# Patient Record
Sex: Female | Born: 1960 | Race: Black or African American | Hispanic: No | Smoking: Never smoker
Health system: Southern US, Community
[De-identification: ages and names within clinical notes are randomized; demographics above are authoritative.]

## PROBLEM LIST (undated history)

## (undated) DIAGNOSIS — F32A Depression, unspecified: Secondary | ICD-10-CM

## (undated) DIAGNOSIS — I10 Essential (primary) hypertension: Secondary | ICD-10-CM

## (undated) DIAGNOSIS — B20 Human immunodeficiency virus [HIV] disease: Secondary | ICD-10-CM

## (undated) DIAGNOSIS — N959 Unspecified menopausal and perimenopausal disorder: Secondary | ICD-10-CM

## (undated) DIAGNOSIS — Z21 Asymptomatic human immunodeficiency virus [HIV] infection status: Secondary | ICD-10-CM

## (undated) DIAGNOSIS — F419 Anxiety disorder, unspecified: Secondary | ICD-10-CM

## (undated) HISTORY — DX: Anxiety disorder, unspecified: F41.9

## (undated) HISTORY — DX: Essential (primary) hypertension: I10

## (undated) HISTORY — DX: Asymptomatic human immunodeficiency virus (hiv) infection status: Z21

## (undated) HISTORY — DX: Human immunodeficiency virus (HIV) disease: B20

## (undated) HISTORY — DX: Depression, unspecified: F32.A

## (undated) HISTORY — DX: Unspecified menopausal and perimenopausal disorder: N95.9

## (undated) HISTORY — PX: NO PAST SURGERIES: SHX2092

---

## 2003-02-12 ENCOUNTER — Encounter (INDEPENDENT_AMBULATORY_CARE_PROVIDER_SITE_OTHER): Payer: Self-pay | Admitting: *Deleted

## 2003-02-12 LAB — CONVERTED CEMR LAB: CD4 Count: 90 microliters

## 2004-02-16 ENCOUNTER — Ambulatory Visit (HOSPITAL_COMMUNITY): Admission: RE | Admit: 2004-02-16 | Discharge: 2004-02-16 | Payer: Self-pay | Admitting: Infectious Diseases

## 2004-02-16 ENCOUNTER — Encounter: Admission: RE | Admit: 2004-02-16 | Discharge: 2004-02-16 | Payer: Self-pay | Admitting: Infectious Diseases

## 2004-03-01 ENCOUNTER — Encounter: Admission: RE | Admit: 2004-03-01 | Discharge: 2004-03-01 | Payer: Self-pay | Admitting: Infectious Diseases

## 2004-03-01 ENCOUNTER — Encounter (INDEPENDENT_AMBULATORY_CARE_PROVIDER_SITE_OTHER): Payer: Self-pay | Admitting: Infectious Diseases

## 2004-03-01 ENCOUNTER — Ambulatory Visit (HOSPITAL_COMMUNITY): Admission: RE | Admit: 2004-03-01 | Discharge: 2004-03-01 | Payer: Self-pay | Admitting: Infectious Diseases

## 2004-04-04 ENCOUNTER — Encounter: Admission: RE | Admit: 2004-04-04 | Discharge: 2004-04-04 | Payer: Self-pay | Admitting: Infectious Diseases

## 2004-08-18 ENCOUNTER — Ambulatory Visit (HOSPITAL_COMMUNITY): Admission: RE | Admit: 2004-08-18 | Discharge: 2004-08-18 | Payer: Self-pay | Admitting: Infectious Diseases

## 2004-08-18 ENCOUNTER — Ambulatory Visit: Payer: Self-pay | Admitting: Infectious Diseases

## 2004-09-12 ENCOUNTER — Ambulatory Visit: Payer: Self-pay | Admitting: Infectious Diseases

## 2004-12-19 ENCOUNTER — Encounter: Admission: RE | Admit: 2004-12-19 | Discharge: 2004-12-19 | Payer: Self-pay | Admitting: Obstetrics & Gynecology

## 2005-01-09 ENCOUNTER — Ambulatory Visit: Payer: Self-pay | Admitting: Infectious Diseases

## 2005-01-09 ENCOUNTER — Ambulatory Visit (HOSPITAL_COMMUNITY): Admission: RE | Admit: 2005-01-09 | Discharge: 2005-01-09 | Payer: Self-pay | Admitting: Infectious Diseases

## 2005-01-30 ENCOUNTER — Ambulatory Visit: Payer: Self-pay | Admitting: Infectious Diseases

## 2005-05-11 ENCOUNTER — Ambulatory Visit (HOSPITAL_COMMUNITY): Admission: RE | Admit: 2005-05-11 | Discharge: 2005-05-11 | Payer: Self-pay | Admitting: Infectious Diseases

## 2005-05-11 ENCOUNTER — Ambulatory Visit: Payer: Self-pay | Admitting: Infectious Diseases

## 2005-06-08 ENCOUNTER — Ambulatory Visit: Payer: Self-pay | Admitting: Infectious Diseases

## 2005-06-16 ENCOUNTER — Ambulatory Visit: Payer: Self-pay | Admitting: Infectious Diseases

## 2005-10-09 ENCOUNTER — Ambulatory Visit (HOSPITAL_COMMUNITY): Admission: RE | Admit: 2005-10-09 | Discharge: 2005-10-09 | Payer: Self-pay | Admitting: Infectious Diseases

## 2005-10-09 ENCOUNTER — Ambulatory Visit: Payer: Self-pay | Admitting: Infectious Diseases

## 2005-10-23 ENCOUNTER — Ambulatory Visit: Payer: Self-pay | Admitting: Infectious Diseases

## 2005-12-15 ENCOUNTER — Encounter: Payer: Self-pay | Admitting: Family Medicine

## 2005-12-15 ENCOUNTER — Ambulatory Visit: Payer: Self-pay | Admitting: Family Medicine

## 2005-12-25 ENCOUNTER — Encounter: Admission: RE | Admit: 2005-12-25 | Discharge: 2005-12-25 | Payer: Self-pay | Admitting: Internal Medicine

## 2006-01-08 ENCOUNTER — Ambulatory Visit: Payer: Self-pay | Admitting: Infectious Diseases

## 2006-01-22 ENCOUNTER — Ambulatory Visit: Payer: Self-pay | Admitting: Infectious Diseases

## 2006-04-16 ENCOUNTER — Ambulatory Visit: Payer: Self-pay | Admitting: Infectious Diseases

## 2006-04-16 ENCOUNTER — Encounter: Admission: RE | Admit: 2006-04-16 | Discharge: 2006-04-16 | Payer: Self-pay | Admitting: Infectious Diseases

## 2006-04-16 ENCOUNTER — Encounter (INDEPENDENT_AMBULATORY_CARE_PROVIDER_SITE_OTHER): Payer: Self-pay | Admitting: *Deleted

## 2006-04-16 LAB — CONVERTED CEMR LAB
CD4 Count: 1010 microliters
HIV 1 RNA Quant: 49 copies/mL

## 2006-04-30 ENCOUNTER — Ambulatory Visit: Payer: Self-pay | Admitting: Infectious Diseases

## 2006-07-12 ENCOUNTER — Encounter (INDEPENDENT_AMBULATORY_CARE_PROVIDER_SITE_OTHER): Payer: Self-pay | Admitting: *Deleted

## 2006-07-12 ENCOUNTER — Ambulatory Visit: Payer: Self-pay | Admitting: Infectious Diseases

## 2006-07-12 ENCOUNTER — Encounter: Admission: RE | Admit: 2006-07-12 | Discharge: 2006-07-12 | Payer: Self-pay | Admitting: Infectious Diseases

## 2006-07-12 LAB — CONVERTED CEMR LAB
CD4 Count: 970 microliters
HIV 1 RNA Quant: 135 copies/mL

## 2006-07-30 ENCOUNTER — Ambulatory Visit: Payer: Self-pay | Admitting: Infectious Diseases

## 2006-08-06 ENCOUNTER — Emergency Department (HOSPITAL_COMMUNITY): Admission: EM | Admit: 2006-08-06 | Discharge: 2006-08-06 | Payer: Self-pay | Admitting: Emergency Medicine

## 2006-10-29 ENCOUNTER — Encounter: Admission: RE | Admit: 2006-10-29 | Discharge: 2006-10-29 | Payer: Self-pay | Admitting: Infectious Diseases

## 2006-10-29 ENCOUNTER — Encounter (INDEPENDENT_AMBULATORY_CARE_PROVIDER_SITE_OTHER): Payer: Self-pay | Admitting: *Deleted

## 2006-10-29 ENCOUNTER — Ambulatory Visit: Payer: Self-pay | Admitting: Infectious Diseases

## 2006-11-21 ENCOUNTER — Ambulatory Visit: Payer: Self-pay | Admitting: Infectious Diseases

## 2006-11-21 ENCOUNTER — Encounter: Payer: Self-pay | Admitting: Internal Medicine

## 2006-11-22 ENCOUNTER — Encounter (INDEPENDENT_AMBULATORY_CARE_PROVIDER_SITE_OTHER): Payer: Self-pay | Admitting: Infectious Diseases

## 2006-12-14 ENCOUNTER — Encounter: Payer: Self-pay | Admitting: Family Medicine

## 2006-12-14 ENCOUNTER — Ambulatory Visit: Payer: Self-pay | Admitting: Obstetrics & Gynecology

## 2006-12-14 ENCOUNTER — Encounter (INDEPENDENT_AMBULATORY_CARE_PROVIDER_SITE_OTHER): Payer: Self-pay | Admitting: *Deleted

## 2006-12-14 ENCOUNTER — Encounter: Payer: Self-pay | Admitting: Internal Medicine

## 2006-12-14 LAB — CONVERTED CEMR LAB: Pap Smear: NORMAL

## 2006-12-26 ENCOUNTER — Encounter: Admission: RE | Admit: 2006-12-26 | Discharge: 2006-12-26 | Payer: Self-pay | Admitting: Internal Medicine

## 2007-02-04 ENCOUNTER — Encounter (INDEPENDENT_AMBULATORY_CARE_PROVIDER_SITE_OTHER): Payer: Self-pay | Admitting: *Deleted

## 2007-02-04 LAB — CONVERTED CEMR LAB

## 2007-02-17 ENCOUNTER — Encounter (INDEPENDENT_AMBULATORY_CARE_PROVIDER_SITE_OTHER): Payer: Self-pay | Admitting: *Deleted

## 2007-04-29 ENCOUNTER — Encounter: Admission: RE | Admit: 2007-04-29 | Discharge: 2007-04-29 | Payer: Self-pay | Admitting: Infectious Diseases

## 2007-04-29 ENCOUNTER — Ambulatory Visit: Payer: Self-pay | Admitting: Infectious Diseases

## 2007-04-29 ENCOUNTER — Encounter: Payer: Self-pay | Admitting: Internal Medicine

## 2007-05-09 ENCOUNTER — Ambulatory Visit: Payer: Self-pay | Admitting: Obstetrics & Gynecology

## 2007-05-20 ENCOUNTER — Ambulatory Visit: Payer: Self-pay | Admitting: Infectious Diseases

## 2007-05-20 ENCOUNTER — Encounter (INDEPENDENT_AMBULATORY_CARE_PROVIDER_SITE_OTHER): Payer: Self-pay | Admitting: *Deleted

## 2007-05-20 DIAGNOSIS — F329 Major depressive disorder, single episode, unspecified: Secondary | ICD-10-CM | POA: Insufficient documentation

## 2007-05-20 DIAGNOSIS — F32A Depression, unspecified: Secondary | ICD-10-CM | POA: Insufficient documentation

## 2007-05-20 DIAGNOSIS — B20 Human immunodeficiency virus [HIV] disease: Secondary | ICD-10-CM

## 2007-08-16 ENCOUNTER — Encounter: Admission: RE | Admit: 2007-08-16 | Discharge: 2007-08-16 | Payer: Self-pay | Admitting: Internal Medicine

## 2007-08-16 ENCOUNTER — Ambulatory Visit: Payer: Self-pay | Admitting: Internal Medicine

## 2007-08-16 LAB — CONVERTED CEMR LAB
AST: 21 units/L (ref 0–37)
BUN: 8 mg/dL (ref 6–23)
Basophils Relative: 1 % (ref 0–1)
Calcium: 9 mg/dL (ref 8.4–10.5)
Chloride: 102 meq/L (ref 96–112)
Creatinine, Ser: 0.82 mg/dL (ref 0.40–1.20)
Eosinophils Absolute: 0.1 10*3/uL (ref 0.0–0.7)
HIV-1 RNA Quant, Log: 2.52 — ABNORMAL HIGH (ref <1.70)
Hemoglobin: 14.3 g/dL (ref 12.0–15.0)
MCHC: 34.4 g/dL (ref 30.0–36.0)
MCV: 84 fL (ref 78.0–100.0)
Monocytes Absolute: 0.3 10*3/uL (ref 0.2–0.7)
Monocytes Relative: 5 % (ref 3–11)
Neutro Abs: 2.7 10*3/uL (ref 1.7–7.7)
RBC: 4.95 M/uL (ref 3.87–5.11)

## 2007-09-20 ENCOUNTER — Encounter (INDEPENDENT_AMBULATORY_CARE_PROVIDER_SITE_OTHER): Payer: Self-pay | Admitting: *Deleted

## 2007-09-20 ENCOUNTER — Ambulatory Visit: Payer: Self-pay | Admitting: Internal Medicine

## 2007-10-28 ENCOUNTER — Telehealth: Payer: Self-pay | Admitting: Internal Medicine

## 2007-11-28 ENCOUNTER — Telehealth: Payer: Self-pay | Admitting: Internal Medicine

## 2007-12-09 ENCOUNTER — Encounter (INDEPENDENT_AMBULATORY_CARE_PROVIDER_SITE_OTHER): Payer: Self-pay | Admitting: *Deleted

## 2007-12-10 ENCOUNTER — Encounter (INDEPENDENT_AMBULATORY_CARE_PROVIDER_SITE_OTHER): Payer: Self-pay | Admitting: *Deleted

## 2007-12-24 ENCOUNTER — Encounter: Admission: RE | Admit: 2007-12-24 | Discharge: 2007-12-24 | Payer: Self-pay | Admitting: Internal Medicine

## 2007-12-24 ENCOUNTER — Ambulatory Visit: Payer: Self-pay | Admitting: Internal Medicine

## 2007-12-24 LAB — CONVERTED CEMR LAB
ALT: 16 units/L (ref 0–35)
AST: 22 units/L (ref 0–37)
Alkaline Phosphatase: 59 units/L (ref 39–117)
BUN: 8 mg/dL (ref 6–23)
Creatinine, Ser: 0.77 mg/dL (ref 0.40–1.20)
Eosinophils Relative: 0 % (ref 0–5)
HCT: 41.1 % (ref 36.0–46.0)
Lymphocytes Relative: 39 % (ref 12–46)
Lymphs Abs: 2.6 10*3/uL (ref 0.7–4.0)
Neutro Abs: 3.8 10*3/uL (ref 1.7–7.7)
Neutrophils Relative %: 56 % (ref 43–77)
Platelets: 284 10*3/uL (ref 150–400)
Potassium: 4.3 meq/L (ref 3.5–5.3)
Sodium: 138 meq/L (ref 135–145)
Total Bilirubin: 1 mg/dL (ref 0.3–1.2)
WBC: 6.7 10*3/uL (ref 4.0–10.5)

## 2007-12-30 ENCOUNTER — Telehealth: Payer: Self-pay | Admitting: Internal Medicine

## 2008-01-06 ENCOUNTER — Encounter (INDEPENDENT_AMBULATORY_CARE_PROVIDER_SITE_OTHER): Payer: Self-pay | Admitting: *Deleted

## 2008-01-07 ENCOUNTER — Ambulatory Visit: Payer: Self-pay | Admitting: Internal Medicine

## 2008-01-10 ENCOUNTER — Ambulatory Visit (HOSPITAL_COMMUNITY): Admission: RE | Admit: 2008-01-10 | Discharge: 2008-01-10 | Payer: Self-pay | Admitting: Internal Medicine

## 2008-01-17 ENCOUNTER — Encounter (INDEPENDENT_AMBULATORY_CARE_PROVIDER_SITE_OTHER): Payer: Self-pay | Admitting: *Deleted

## 2008-01-17 ENCOUNTER — Encounter: Payer: Self-pay | Admitting: Internal Medicine

## 2008-01-17 ENCOUNTER — Ambulatory Visit: Payer: Self-pay | Admitting: Internal Medicine

## 2008-01-22 ENCOUNTER — Encounter: Payer: Self-pay | Admitting: Internal Medicine

## 2008-01-29 ENCOUNTER — Telehealth: Payer: Self-pay | Admitting: Internal Medicine

## 2008-02-17 ENCOUNTER — Encounter (INDEPENDENT_AMBULATORY_CARE_PROVIDER_SITE_OTHER): Payer: Self-pay | Admitting: *Deleted

## 2008-02-25 ENCOUNTER — Telehealth: Payer: Self-pay | Admitting: Internal Medicine

## 2008-03-17 ENCOUNTER — Encounter: Payer: Self-pay | Admitting: Internal Medicine

## 2008-03-26 ENCOUNTER — Telehealth (INDEPENDENT_AMBULATORY_CARE_PROVIDER_SITE_OTHER): Payer: Self-pay | Admitting: *Deleted

## 2008-03-30 ENCOUNTER — Encounter: Payer: Self-pay | Admitting: Internal Medicine

## 2008-04-06 ENCOUNTER — Encounter: Admission: RE | Admit: 2008-04-06 | Discharge: 2008-04-06 | Payer: Self-pay | Admitting: Internal Medicine

## 2008-04-06 ENCOUNTER — Ambulatory Visit: Payer: Self-pay | Admitting: Internal Medicine

## 2008-04-06 LAB — CONVERTED CEMR LAB
Albumin: 4.2 g/dL (ref 3.5–5.2)
BUN: 11 mg/dL (ref 6–23)
CO2: 22 meq/L (ref 19–32)
Eosinophils Relative: 1 % (ref 0–5)
Glucose, Bld: 86 mg/dL (ref 70–99)
HCT: 39.3 % (ref 36.0–46.0)
HIV 1 RNA Quant: 99 copies/mL — ABNORMAL HIGH (ref <50)
HIV-1 RNA Quant, Log: 2 — ABNORMAL HIGH (ref <1.70)
Hemoglobin: 13.7 g/dL (ref 12.0–15.0)
Lymphocytes Relative: 44 % (ref 12–46)
Lymphs Abs: 3.5 10*3/uL (ref 0.7–4.0)
Monocytes Absolute: 0.4 10*3/uL (ref 0.1–1.0)
Monocytes Relative: 5 % (ref 3–12)
Platelets: 262 10*3/uL (ref 150–400)
RBC: 4.7 M/uL (ref 3.87–5.11)
Sodium: 138 meq/L (ref 135–145)
Total Bilirubin: 0.5 mg/dL (ref 0.3–1.2)
Total Protein: 7.8 g/dL (ref 6.0–8.3)
WBC: 8.1 10*3/uL (ref 4.0–10.5)

## 2008-04-22 ENCOUNTER — Ambulatory Visit: Payer: Self-pay | Admitting: Internal Medicine

## 2008-04-22 DIAGNOSIS — R05 Cough: Secondary | ICD-10-CM

## 2008-04-27 ENCOUNTER — Telehealth (INDEPENDENT_AMBULATORY_CARE_PROVIDER_SITE_OTHER): Payer: Self-pay | Admitting: *Deleted

## 2008-05-19 ENCOUNTER — Encounter: Payer: Self-pay | Admitting: Internal Medicine

## 2008-05-25 ENCOUNTER — Telehealth (INDEPENDENT_AMBULATORY_CARE_PROVIDER_SITE_OTHER): Payer: Self-pay | Admitting: *Deleted

## 2008-05-29 ENCOUNTER — Emergency Department (HOSPITAL_COMMUNITY): Admission: EM | Admit: 2008-05-29 | Discharge: 2008-05-29 | Payer: Self-pay | Admitting: Family Medicine

## 2008-06-01 ENCOUNTER — Telehealth: Payer: Self-pay | Admitting: Internal Medicine

## 2008-06-24 ENCOUNTER — Telehealth (INDEPENDENT_AMBULATORY_CARE_PROVIDER_SITE_OTHER): Payer: Self-pay | Admitting: *Deleted

## 2008-07-22 ENCOUNTER — Telehealth (INDEPENDENT_AMBULATORY_CARE_PROVIDER_SITE_OTHER): Payer: Self-pay | Admitting: *Deleted

## 2008-08-24 ENCOUNTER — Telehealth (INDEPENDENT_AMBULATORY_CARE_PROVIDER_SITE_OTHER): Payer: Self-pay | Admitting: *Deleted

## 2008-09-18 ENCOUNTER — Telehealth (INDEPENDENT_AMBULATORY_CARE_PROVIDER_SITE_OTHER): Payer: Self-pay | Admitting: *Deleted

## 2008-09-21 ENCOUNTER — Encounter (INDEPENDENT_AMBULATORY_CARE_PROVIDER_SITE_OTHER): Payer: Self-pay | Admitting: *Deleted

## 2008-09-21 ENCOUNTER — Ambulatory Visit: Payer: Self-pay | Admitting: Internal Medicine

## 2008-09-21 LAB — CONVERTED CEMR LAB
ALT: 12 units/L (ref 0–35)
Basophils Absolute: 0 10*3/uL (ref 0.0–0.1)
CO2: 19 meq/L (ref 19–32)
Calcium: 9.3 mg/dL (ref 8.4–10.5)
Chloride: 105 meq/L (ref 96–112)
HIV 1 RNA Quant: 518 copies/mL — ABNORMAL HIGH (ref <50)
Hemoglobin: 13.7 g/dL (ref 12.0–15.0)
Lymphocytes Relative: 46 % (ref 12–46)
Neutro Abs: 2.9 10*3/uL (ref 1.7–7.7)
Platelets: 289 10*3/uL (ref 150–400)
RDW: 14.3 % (ref 11.5–15.5)
Sodium: 138 meq/L (ref 135–145)
Total Protein: 7.1 g/dL (ref 6.0–8.3)

## 2008-10-09 ENCOUNTER — Ambulatory Visit: Payer: Self-pay | Admitting: Internal Medicine

## 2008-10-09 DIAGNOSIS — K047 Periapical abscess without sinus: Secondary | ICD-10-CM

## 2008-10-21 ENCOUNTER — Telehealth (INDEPENDENT_AMBULATORY_CARE_PROVIDER_SITE_OTHER): Payer: Self-pay | Admitting: *Deleted

## 2008-11-18 ENCOUNTER — Telehealth (INDEPENDENT_AMBULATORY_CARE_PROVIDER_SITE_OTHER): Payer: Self-pay | Admitting: *Deleted

## 2008-12-14 ENCOUNTER — Telehealth (INDEPENDENT_AMBULATORY_CARE_PROVIDER_SITE_OTHER): Payer: Self-pay | Admitting: *Deleted

## 2009-01-11 ENCOUNTER — Ambulatory Visit: Payer: Self-pay | Admitting: Internal Medicine

## 2009-01-11 LAB — CONVERTED CEMR LAB
ALT: 13 units/L (ref 0–35)
AST: 18 units/L (ref 0–37)
Alkaline Phosphatase: 61 units/L (ref 39–117)
Basophils Absolute: 0 10*3/uL (ref 0.0–0.1)
Basophils Relative: 0 % (ref 0–1)
Calcium: 9 mg/dL (ref 8.4–10.5)
Chloride: 107 meq/L (ref 96–112)
Creatinine, Ser: 0.81 mg/dL (ref 0.40–1.20)
MCHC: 33.3 g/dL (ref 30.0–36.0)
Monocytes Absolute: 0.3 10*3/uL (ref 0.1–1.0)
Neutro Abs: 2.3 10*3/uL (ref 1.7–7.7)
Neutrophils Relative %: 37 % — ABNORMAL LOW (ref 43–77)
Potassium: 4.5 meq/L (ref 3.5–5.3)
RDW: 14.2 % (ref 11.5–15.5)

## 2009-01-15 ENCOUNTER — Telehealth (INDEPENDENT_AMBULATORY_CARE_PROVIDER_SITE_OTHER): Payer: Self-pay | Admitting: *Deleted

## 2009-01-27 ENCOUNTER — Ambulatory Visit: Payer: Self-pay | Admitting: Internal Medicine

## 2009-02-02 ENCOUNTER — Encounter (INDEPENDENT_AMBULATORY_CARE_PROVIDER_SITE_OTHER): Payer: Self-pay | Admitting: *Deleted

## 2009-02-15 ENCOUNTER — Telehealth (INDEPENDENT_AMBULATORY_CARE_PROVIDER_SITE_OTHER): Payer: Self-pay | Admitting: *Deleted

## 2009-02-16 ENCOUNTER — Ambulatory Visit (HOSPITAL_COMMUNITY): Admission: RE | Admit: 2009-02-16 | Discharge: 2009-02-16 | Payer: Self-pay | Admitting: Internal Medicine

## 2009-02-24 ENCOUNTER — Encounter (INDEPENDENT_AMBULATORY_CARE_PROVIDER_SITE_OTHER): Payer: Self-pay | Admitting: *Deleted

## 2009-02-26 ENCOUNTER — Ambulatory Visit: Payer: Self-pay | Admitting: Internal Medicine

## 2009-02-26 ENCOUNTER — Encounter: Payer: Self-pay | Admitting: Internal Medicine

## 2009-03-05 ENCOUNTER — Encounter: Payer: Self-pay | Admitting: Internal Medicine

## 2009-03-15 ENCOUNTER — Telehealth (INDEPENDENT_AMBULATORY_CARE_PROVIDER_SITE_OTHER): Payer: Self-pay | Admitting: *Deleted

## 2009-04-07 ENCOUNTER — Telehealth (INDEPENDENT_AMBULATORY_CARE_PROVIDER_SITE_OTHER): Payer: Self-pay | Admitting: *Deleted

## 2009-04-27 ENCOUNTER — Ambulatory Visit: Payer: Self-pay | Admitting: Internal Medicine

## 2009-04-27 LAB — CONVERTED CEMR LAB
Albumin: 3.8 g/dL (ref 3.5–5.2)
Basophils Absolute: 0 10*3/uL (ref 0.0–0.1)
Basophils Relative: 1 % (ref 0–1)
CO2: 24 meq/L (ref 19–32)
Calcium: 9.4 mg/dL (ref 8.4–10.5)
Chloride: 104 meq/L (ref 96–112)
Cholesterol: 177 mg/dL (ref 0–200)
GFR calc Af Amer: >60 mL/min (ref >60)
GFR calc non Af Amer: >60 mL/min (ref >60)
Glucose, Bld: 96 mg/dL (ref 70–99)
HIV-1 RNA Quant, Log: <1.68 (ref <1.68)
Lymphocytes Relative: 41 % (ref 12–46)
MCHC: 33.4 g/dL (ref 30.0–36.0)
Neutro Abs: 2.5 10*3/uL (ref 1.7–7.7)
Neutrophils Relative %: 48 % (ref 43–77)
Platelets: 236 10*3/uL (ref 150–400)
RDW: 14.2 % (ref 11.5–15.5)
Sodium: 140 meq/L (ref 135–145)
Total Bilirubin: 0.5 mg/dL (ref 0.3–1.2)
Total Protein: 7.3 g/dL (ref 6.0–8.3)
Triglycerides: 159 mg/dL — ABNORMAL HIGH (ref <150)

## 2009-05-05 ENCOUNTER — Telehealth (INDEPENDENT_AMBULATORY_CARE_PROVIDER_SITE_OTHER): Payer: Self-pay | Admitting: *Deleted

## 2009-05-12 ENCOUNTER — Ambulatory Visit: Payer: Self-pay | Admitting: Internal Medicine

## 2009-06-08 ENCOUNTER — Telehealth (INDEPENDENT_AMBULATORY_CARE_PROVIDER_SITE_OTHER): Payer: Self-pay | Admitting: *Deleted

## 2009-07-05 ENCOUNTER — Telehealth (INDEPENDENT_AMBULATORY_CARE_PROVIDER_SITE_OTHER): Payer: Self-pay | Admitting: *Deleted

## 2009-07-05 ENCOUNTER — Ambulatory Visit: Payer: Self-pay | Admitting: Internal Medicine

## 2009-07-16 ENCOUNTER — Encounter: Admission: RE | Admit: 2009-07-16 | Discharge: 2009-07-16 | Payer: Self-pay | Admitting: Specialist

## 2009-07-30 ENCOUNTER — Telehealth (INDEPENDENT_AMBULATORY_CARE_PROVIDER_SITE_OTHER): Payer: Self-pay | Admitting: *Deleted

## 2009-08-23 ENCOUNTER — Ambulatory Visit: Payer: Self-pay | Admitting: Internal Medicine

## 2009-08-23 LAB — CONVERTED CEMR LAB
ALT: 12 units/L (ref 0–35)
Albumin: 4.1 g/dL (ref 3.5–5.2)
Alkaline Phosphatase: 71 units/L (ref 39–117)
Basophils Relative: 0 % (ref 0–1)
CO2: 22 meq/L (ref 19–32)
Eosinophils Absolute: 0.1 10*3/uL (ref 0.0–0.7)
Glucose, Bld: 87 mg/dL (ref 70–99)
HIV 1 RNA Quant: 224 copies/mL — ABNORMAL HIGH (ref <48)
MCHC: 32.4 g/dL (ref 30.0–36.0)
MCV: 87.5 fL (ref >=78.0)
Monocytes Relative: 4 % (ref 3–12)
Neutro Abs: 2.9 10*3/uL (ref 1.7–7.7)
Neutrophils Relative %: 50 % (ref 43–77)
Platelets: 241 10*3/uL (ref 150–400)
Potassium: 4.7 meq/L (ref 3.5–5.3)
RBC: 4.73 M/uL (ref 3.87–5.11)
Sodium: 141 meq/L (ref 135–145)
Total Protein: 7.5 g/dL (ref 6.0–8.3)
WBC: 5.8 10*3/uL (ref 4.0–10.5)

## 2009-09-01 ENCOUNTER — Telehealth (INDEPENDENT_AMBULATORY_CARE_PROVIDER_SITE_OTHER): Payer: Self-pay | Admitting: *Deleted

## 2009-09-07 ENCOUNTER — Ambulatory Visit: Payer: Self-pay | Admitting: Internal Medicine

## 2009-09-29 ENCOUNTER — Telehealth (INDEPENDENT_AMBULATORY_CARE_PROVIDER_SITE_OTHER): Payer: Self-pay | Admitting: *Deleted

## 2009-10-28 ENCOUNTER — Telehealth (INDEPENDENT_AMBULATORY_CARE_PROVIDER_SITE_OTHER): Payer: Self-pay | Admitting: *Deleted

## 2009-12-09 ENCOUNTER — Telehealth (INDEPENDENT_AMBULATORY_CARE_PROVIDER_SITE_OTHER): Payer: Self-pay | Admitting: *Deleted

## 2009-12-27 ENCOUNTER — Ambulatory Visit: Payer: Self-pay | Admitting: Internal Medicine

## 2009-12-27 ENCOUNTER — Encounter (INDEPENDENT_AMBULATORY_CARE_PROVIDER_SITE_OTHER): Payer: Self-pay | Admitting: *Deleted

## 2009-12-27 LAB — CONVERTED CEMR LAB: HIV 1 RNA Quant: 402 copies/mL — ABNORMAL HIGH (ref <48)

## 2009-12-28 ENCOUNTER — Telehealth (INDEPENDENT_AMBULATORY_CARE_PROVIDER_SITE_OTHER): Payer: Self-pay | Admitting: *Deleted

## 2009-12-28 ENCOUNTER — Encounter: Payer: Self-pay | Admitting: Internal Medicine

## 2009-12-28 LAB — CONVERTED CEMR LAB
ALT: 13 units/L (ref 0–35)
Alkaline Phosphatase: 74 units/L (ref 39–117)
Basophils Absolute: 0 10*3/uL (ref 0.0–0.1)
Basophils Relative: 0 % (ref 0–1)
CO2: 24 meq/L (ref 19–32)
Creatinine, Ser: 0.74 mg/dL (ref 0.40–1.20)
Eosinophils Absolute: 0.1 10*3/uL (ref 0.0–0.7)
MCHC: 31.9 g/dL (ref 30.0–36.0)
MCV: 86.1 fL (ref >=78.0)
Monocytes Relative: 4 % (ref 3–12)
Neutro Abs: 2.8 10*3/uL (ref 1.7–7.7)
Neutrophils Relative %: 42 % — ABNORMAL LOW (ref 43–77)
Platelets: 291 10*3/uL (ref 150–400)
RBC: 4.96 M/uL (ref 3.87–5.11)
RDW: 14.7 % (ref 11.5–15.5)
Sodium: 139 meq/L (ref 135–145)
Total Bilirubin: 0.5 mg/dL (ref 0.3–1.2)
Total Protein: 7.9 g/dL (ref 6.0–8.3)

## 2010-01-12 ENCOUNTER — Ambulatory Visit: Payer: Self-pay | Admitting: Internal Medicine

## 2010-01-20 ENCOUNTER — Encounter (INDEPENDENT_AMBULATORY_CARE_PROVIDER_SITE_OTHER): Payer: Self-pay | Admitting: *Deleted

## 2010-01-27 ENCOUNTER — Telehealth (INDEPENDENT_AMBULATORY_CARE_PROVIDER_SITE_OTHER): Payer: Self-pay | Admitting: *Deleted

## 2010-02-21 ENCOUNTER — Telehealth (INDEPENDENT_AMBULATORY_CARE_PROVIDER_SITE_OTHER): Payer: Self-pay | Admitting: *Deleted

## 2010-03-24 ENCOUNTER — Telehealth (INDEPENDENT_AMBULATORY_CARE_PROVIDER_SITE_OTHER): Payer: Self-pay | Admitting: *Deleted

## 2010-04-13 ENCOUNTER — Ambulatory Visit: Payer: Self-pay | Admitting: Internal Medicine

## 2010-04-13 LAB — CONVERTED CEMR LAB
Albumin: 4.2 g/dL (ref 3.5–5.2)
BUN: 5 mg/dL — ABNORMAL LOW (ref 6–23)
Basophils Relative: 0 % (ref 0–1)
CO2: 21 meq/L (ref 19–32)
Eosinophils Relative: 3 % (ref 0–5)
Glucose, Bld: 87 mg/dL (ref 70–99)
HCT: 39.5 % (ref 36.0–46.0)
HIV 1 RNA Quant: <48 copies/mL (ref <48)
Hemoglobin: 13.1 g/dL (ref 12.0–15.0)
Lymphocytes Relative: 50 % — ABNORMAL HIGH (ref 12–46)
MCHC: 33.2 g/dL (ref 30.0–36.0)
Monocytes Absolute: 0.3 10*3/uL (ref 0.1–1.0)
Monocytes Relative: 5 % (ref 3–12)
Neutro Abs: 2.5 10*3/uL (ref 1.7–7.7)
Potassium: 4.6 meq/L (ref 3.5–5.3)
RBC: 4.6 M/uL (ref 3.87–5.11)
Sodium: 137 meq/L (ref 135–145)
Total Protein: 7.5 g/dL (ref 6.0–8.3)

## 2010-04-18 ENCOUNTER — Telehealth: Payer: Self-pay | Admitting: Internal Medicine

## 2010-04-27 ENCOUNTER — Ambulatory Visit: Payer: Self-pay | Admitting: Internal Medicine

## 2010-04-27 DIAGNOSIS — I10 Essential (primary) hypertension: Secondary | ICD-10-CM | POA: Insufficient documentation

## 2010-05-05 ENCOUNTER — Ambulatory Visit (HOSPITAL_COMMUNITY): Admission: RE | Admit: 2010-05-05 | Discharge: 2010-05-05 | Payer: Self-pay | Admitting: Internal Medicine

## 2010-05-13 ENCOUNTER — Ambulatory Visit: Payer: Self-pay | Admitting: Internal Medicine

## 2010-05-17 ENCOUNTER — Telehealth: Payer: Self-pay | Admitting: Internal Medicine

## 2010-05-20 ENCOUNTER — Telehealth: Payer: Self-pay

## 2010-05-20 ENCOUNTER — Encounter: Payer: Self-pay | Admitting: Internal Medicine

## 2010-06-20 ENCOUNTER — Telehealth: Payer: Self-pay | Admitting: Internal Medicine

## 2010-07-13 ENCOUNTER — Telehealth: Payer: Self-pay | Admitting: Internal Medicine

## 2010-08-09 ENCOUNTER — Telehealth: Payer: Self-pay | Admitting: Internal Medicine

## 2010-09-14 ENCOUNTER — Telehealth (INDEPENDENT_AMBULATORY_CARE_PROVIDER_SITE_OTHER): Payer: Self-pay | Admitting: *Deleted

## 2010-10-14 ENCOUNTER — Telehealth (INDEPENDENT_AMBULATORY_CARE_PROVIDER_SITE_OTHER): Payer: Self-pay | Admitting: *Deleted

## 2010-11-02 ENCOUNTER — Ambulatory Visit: Payer: Self-pay | Admitting: Internal Medicine

## 2010-11-02 LAB — CONVERTED CEMR LAB
Alkaline Phosphatase: 75 units/L (ref 39–117)
BUN: 11 mg/dL (ref 6–23)
Basophils Relative: 0 % (ref 0–1)
Creatinine, Ser: 0.77 mg/dL (ref 0.40–1.20)
Glucose, Bld: 87 mg/dL (ref 70–99)
HCT: 39.6 % (ref 36.0–46.0)
HDL: 41 mg/dL (ref >39)
HIV 1 RNA Quant: <20 copies/mL (ref <20)
Hemoglobin: 13.2 g/dL (ref 12.0–15.0)
LDL Cholesterol: 163 mg/dL — ABNORMAL HIGH (ref 0–99)
MCHC: 33.3 g/dL (ref 30.0–36.0)
MCV: 84.8 fL (ref 78.0–100.0)
Monocytes Absolute: 0.2 10*3/uL (ref 0.1–1.0)
Monocytes Relative: 4 % (ref 3–12)
Neutro Abs: 2.4 10*3/uL (ref 1.7–7.7)
RBC: 4.67 M/uL (ref 3.87–5.11)
Total Bilirubin: 0.4 mg/dL (ref 0.3–1.2)

## 2010-12-08 ENCOUNTER — Ambulatory Visit
Admission: RE | Admit: 2010-12-08 | Discharge: 2010-12-08 | Payer: Self-pay | Source: Home / Self Care | Attending: Internal Medicine | Admitting: Internal Medicine

## 2010-12-08 ENCOUNTER — Telehealth (INDEPENDENT_AMBULATORY_CARE_PROVIDER_SITE_OTHER): Payer: Self-pay | Admitting: *Deleted

## 2010-12-15 ENCOUNTER — Ambulatory Visit
Admission: RE | Admit: 2010-12-15 | Discharge: 2010-12-15 | Payer: Self-pay | Source: Home / Self Care | Attending: Internal Medicine | Admitting: Internal Medicine

## 2011-01-08 LAB — CONVERTED CEMR LAB
ALT: 15 units/L (ref 0–35)
Albumin: 4.1 g/dL (ref 3.5–5.2)
Alkaline Phosphatase: 60 units/L (ref 39–117)
BUN: 14 mg/dL (ref 6–23)
Basophils Absolute: 0 10*3/uL (ref 0.0–0.1)
Basophils Relative: 1 % (ref 0–1)
Bilirubin Urine: NEGATIVE
CO2: 23 meq/L (ref 19–32)
Calcium: 9.4 mg/dL (ref 8.4–10.5)
Chloride: 102 meq/L (ref 96–112)
Eosinophils Absolute: 0.1 10*3/uL (ref 0.0–0.7)
Eosinophils Relative: 1 % (ref 0–5)
Glucose, Bld: 88 mg/dL (ref 70–99)
Glucose, Bld: 92 mg/dL (ref 70–99)
HCT: 41.2 % (ref 36.0–46.0)
HDL: 49 mg/dL (ref >39)
HIV 1 RNA Quant: 249 copies/mL — ABNORMAL HIGH (ref <50)
HIV-1 RNA Quant, Log: 2.4 — ABNORMAL HIGH (ref <1.70)
Hemoglobin: 14.3 g/dL (ref 11.7–14.8)
Ketones, ur: NEGATIVE mg/dL
LDL Cholesterol: 116 mg/dL — ABNORMAL HIGH (ref 0–99)
Lymphocytes Relative: 48 % — ABNORMAL HIGH (ref 15–43)
Lymphs Abs: 2.9 10*3/uL (ref 0.7–3.3)
MCHC: 33.3 g/dL (ref 30.0–36.0)
MCHC: 33.4 g/dL (ref 33.1–35.4)
MCV: 87.3 fL (ref 78.0–100.0)
Neutro Abs: 3.1 10*3/uL (ref 1.8–6.8)
Neutrophils Relative %: 46 % — ABNORMAL LOW (ref 47–77)
Platelets: 287 10*3/uL (ref 150–400)
Protein, ur: NEGATIVE mg/dL
RBC: 5.01 M/uL — ABNORMAL HIGH (ref 3.79–4.96)
RDW: 13.9 % (ref 11.5–15.3)
Sodium: 137 meq/L (ref 135–145)
Sodium: 138 meq/L (ref 135–145)
Total Bilirubin: 0.5 mg/dL (ref 0.3–1.2)
Total Protein: 7.6 g/dL (ref 6.0–8.3)
Total Protein: 7.7 g/dL (ref 6.0–8.3)
Triglycerides: 177 mg/dL — ABNORMAL HIGH (ref <150)
Urobilinogen, UA: 0.2 (ref 0.0–1.0)
VLDL: 35 mg/dL (ref 0–40)
WBC: 6.2 10*3/uL (ref 4.0–10.5)

## 2011-01-10 NOTE — Progress Notes (Signed)
Summary: Returning Call   Phone Note Call from Patient   Caller: Patient Summary of Call: Pt stated she has several calls from this office on her phone. She wanted to know why we were calling.  It looks like Annice Pih was trying to reach pt to inform her yeast was present with pap smear results.  Pap smear was normal . Information given.  It looks like Byrd Hesselbach has ordered medications and it was sent to pharmacy. Tomasita Morrow RN  May 20, 2010 2:43 PM

## 2011-01-10 NOTE — Letter (Signed)
Summary: Results Follow-up Letter  [facility]  [address]   [place], [place] [address]   Phone: [phone]  Fax: [phone]          May 20, 2010  [address] [place], [place]  [address]  Dear Ms. Capistran,   The following are the results of your recent test(s):  Test     Result     Pap Smear    Normal__XXX__  Not Normal_____  Comments:  Everything was normal.   I will see you next year for your next PAP smear.  Thank you for coming to the Center for your care.  Sincerely,    Jennet Maduro [facility]

## 2011-01-10 NOTE — Assessment & Plan Note (Signed)
Summary: f/u [mkj]   CC:  follow-up visit, lab results, needs pap, mammo, and and eye exam.  History of Present Illness: Pt here for lab results. She has not missed any doses of her HIV meds. She needs a mammogram scheduled and has a PAP scheduled for this Friday.  Depression History:      The patient is having a depressed mood most of the day but denies diminished interest in her usual daily activities.        The patient denies that she feels like life is not worth living, denies that she wishes that she were dead, and denies that she has thought about ending her life.        Preventive Screening-Counseling & Management  Alcohol-Tobacco     Alcohol drinks/day: 0     Smoking Status: never  Caffeine-Diet-Exercise     Caffeine use/day: coffee everyday     Does Patient Exercise: yes     Type of exercise: active     Exercise (avg: min/session): >60     Times/week: 7  Safety-Violence-Falls     Seat Belt Use: yes   Updated Prior Medication List: TRUVADA 200-300 MG TABS (EMTRICITABINE-TENOFOVIR) once daily KALETRA 200-50 MG TABS (LOPINAVIR-RITONAVIR) Take 2 tablets by mouth twice a day CLARITIN 10 MG  TABS (LORATADINE) Take 1 tablet by mouth once a day CELEXA 20 MG TABS (CITALOPRAM HYDROBROMIDE) Take 1 tablet by mouth once a day ATENOLOL 50 MG TABS (ATENOLOL) Take 1 tablet by mouth once a day  Current Allergies (reviewed today): ! SULFA Review of Systems  The patient denies anorexia, fever, and weight loss.    Vital Signs:  Patient profile:   50 year old female Menstrual status:  regular Height:      70 inches (177.80 cm) Weight:      244.0 pounds (110.91 kg) BMI:     35.14 Temp:     97.7 degrees F (36.50 degrees C) oral Pulse rate:   74 / minute BP sitting:   150 / 96  (left arm)  Vitals Entered By: Wendall Mola CMA Duncan Dull) (Apr 27, 2010 10:38 AM) CC: follow-up visit, lab results, needs pap, mammo, and eye exam Is Patient Diabetic? No Pain Assessment Patient  in pain? no      Nutritional Status BMI of > 30 = obese Nutritional Status Detail appetite "ok"  Does patient need assistance? Functional Status Self care Ambulation Normal Comments no missed doses of meds per patient pt. has stopped Celexa   Physical Exam  General:  alert, well-developed, well-nourished, and well-hydrated.   Head:  normocephalic and atraumatic.   Mouth:  pharynx pink and moist.   Lungs:  normal breath sounds.      Impression & Recommendations:  Problem # 1:  HIV INFECTION (ICD-042) Pt.s most recent CD4ct was 1100 and VL <48 .  Pt instructed to continue the current antiretroviral regimen.  Pt encouraged to take medication regularly and not miss doses.  Pt will f/u in 3 months for repeat blood work and will see me 2 weeks later.  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 Patient Level III ([address]213)Future Orders: T-CD4SP ([facility]) (CD4SP) ... 07/26/2010 T-HIV Viral Load [phone]) ... 07/26/2010 T-Comprehensive Metabolic Panel [phone]) .Marland KitchenMarland Kitchen  07/26/2010 T-CBC w/Diff ([id number]) ... 07/26/2010 T-Lipid Profile [id number]) ... 07/26/2010  Problem # 2:  ESSENTIAL HYPERTENSION (ICD-401.9) will start pt on atenolol and re-check next visit. Her updated medication list for this problem includes:    Atenolol 50 Mg Tabs (Atenolol) .Marland Kitchen... Take 1 tablet by mouth once a day  Problem # 3:  PREVENTIVE HEALTH CARE (ICD-V70.0) PAP scheduled.  will schedule screening mammogram. Orders: Mammogram (Screening) (Mammo)  Medications Added to Medication List This Visit: 1)  Atenolol 50 Mg Tabs (Atenolol) .... Take 1 tablet by mouth once a day  Patient Instructions: 1)  Please schedule a follow-up appointment in 3  months, 2 weeks after labs.  Prescriptions: ATENOLOL 50 MG TABS (ATENOLOL) Take 1 tablet by mouth once a day  #30 x 5   Entered and Authorized by:   Yisroel Ramming MD   Signed by:   Yisroel Ramming MD on 04/27/2010   Method used:   Print then Give to Patient   RxID:   [id number]

## 2011-01-10 NOTE — Progress Notes (Signed)
Summary: NCADAP rxes arrived  Phone Note Outgoing Call   Call placed by: Jennet Maduro RN,  October 14, 2010 2:17 PM Call placed to: Patient Action Taken: Assistance medications ready for pick up Summary of Call: [place] ADAP rxes arrived.  Truvada & Kaletra.  Message left. Jennet Maduro RN  October 14, 2010 2:16 PM      Appended Document: NCADAP rxes arrived Pt. picked up ADAP meds

## 2011-01-10 NOTE — Miscellaneous (Signed)
Summary: New prescription faxed to [facility] NC[facility]AP  Clinical Lists Changes  Medications: Added new medication of FLUCONAZOLE 150 MG TABS (FLUCONAZOLE) Take 1 tablet by mouth once daily for 2 days - Signed Rx of FLUCONAZOLE 150 MG TABS (FLUCONAZOLE) Take 1 tablet by mouth once daily for 2 days;  #2 x 0;  Signed;  Entered by: Paulo Fruit  BS,CPht II,MPH;  Authorized by: Yisroel Ramming MD;  Method used: Electronically to [facility] [phone]*, [address] [place], [place]  [address], Ph: [phone], Fax:    Prescriptions: FLUCONAZOLE 150 MG TABS (FLUCONAZOLE) Take 1 tablet by mouth once daily for 2 days  #2 x 0   Entered by:   Paulo Fruit  BS,CPht II,MPH   Authorized by:   Yisroel Ramming MD   Signed by:   Paulo Fruit  BS,CPht II,MPH on 05/20/2010   Method used:   Electronically to        [facility] [phone]* (retail)       [address]       [place], [place]  [address]       Ph: [phone]       Fax:    RxID:   [id number]  Paulo Fruit  BS,CPht II,MPH  May 20, 2010 12:02 PM

## 2011-01-10 NOTE — Progress Notes (Signed)
Summary: NCADAP/pt assist meds arrived for Mar  Phone Note Refill Request      Prescriptions: KALETRA 200-50 MG TABS (LOPINAVIR-RITONAVIR) Take 2 tablets by mouth twice a day  #120 x 0   Entered by:   Paulo Fruit  BS,CPht II,MPH   Authorized by:   Yisroel Ramming MD   Signed by:   Paulo Fruit  BS,CPht II,MPH on 02/21/2010   Method used:   Samples Given   RxID:   [id number] TRUVADA 200-300 MG TABS (EMTRICITABINE-TENOFOVIR) once daily  #30 x 0   Entered by:   Paulo Fruit  BS,CPht II,MPH   Authorized by:   Yisroel Ramming MD   Signed by:   Paulo Fruit  BS,CPht II,MPH on 02/21/2010   Method used:   Samples Given   RxID:   [id number]   Patient Assist Medication Verification: Medication: Truvada Lot# [id number] Exp Date:04 2014 Tech approval:MLD                Patient Assist Medication Verification: Medication:Kaletra 200/50mg  BMW#[id number] Exp Date:22 Sep 2012 Tech approval:MLD Call placed to patient with message that assistance medications are ready for pick-up. Paulo Fruit  BS,CPht II,MPH  February 21, 2010 3:14 PM

## 2011-01-10 NOTE — Miscellaneous (Signed)
Summary: clinical update/ryan white NcADAP app completed  Clinical Lists Changes  Observations: Added new observation of RWTITLE: D (12/27/2009 13:39) Added new observation of FINASSESSDT: 12/27/2009 (12/27/2009 13:39)

## 2011-01-10 NOTE — Progress Notes (Signed)
Summary: ncadap meds arrived for Aug  Phone Note Refill Request      Prescriptions: KALETRA 200-50 MG TABS (LOPINAVIR-RITONAVIR) Take 2 tablets by mouth twice a day  #120 x 0   Entered by:   Paulo Fruit  BS,CPht II,MPH   Authorized by:   Yisroel Ramming MD   Signed by:   Paulo Fruit  BS,CPht II,MPH on 08/09/2010   Method used:   Samples Given   RxID:   [id number] TRUVADA 200-300 MG TABS (EMTRICITABINE-TENOFOVIR) once daily  #30 x 0   Entered by:   Paulo Fruit  BS,CPht II,MPH   Authorized by:   Yisroel Ramming MD   Signed by:   Paulo Fruit  BS,CPht II,MPH on 08/09/2010   Method used:   Samples Given   RxID:   [id number]  Patient Assist Medication Verification: Medication name: Kaletra 200/50mg  RX # [id number] Tech approval:MLD  Patient Assist Medication Verification: Medication name:Truvada RX # [id number] Tech approval:MLD Call placed to patient with message that assistance medications are ready for pick-up. Paulo Fruit  BS,CPht II,MPH  August 09, 2010 4:37 PM'

## 2011-01-10 NOTE — Progress Notes (Signed)
Summary: NCADAP/pt assist meds arrived for may  Phone Note Refill Request      Prescriptions: KALETRA 200-50 MG TABS (LOPINAVIR-RITONAVIR) Take 2 tablets by mouth twice a day  #120 x 0   Entered by:   Paulo Fruit  BS,CPht II,MPH   Authorized by:   Yisroel Ramming MD   Signed by:   Paulo Fruit  BS,CPht II,MPH on 04/18/2010   Method used:   Samples Given   RxID:   [id number] TRUVADA 200-300 MG TABS (EMTRICITABINE-TENOFOVIR) once daily  #30 x 0   Entered by:   Paulo Fruit  BS,CPht II,MPH   Authorized by:   Yisroel Ramming MD   Signed by:   Paulo Fruit  BS,CPht II,MPH on 04/18/2010   Method used:   Samples Given   RxID:   [id number]   Patient Assist Medication Verification: Medication: Truvada Lot# [id number] Exp Date:10 2014 Tech approval:MLD                Patient Assist Medication Verification: Medication:Kaletra 200/50mg  MWU#[id number] Exp Date:24 Jan 2013 Tech approval:MLD Call placed to patient with message that assistance medications are ready for pick-up. Paulo Fruit  BS,CPht II,MPH  Apr 18, 2010 4:36 PM

## 2011-01-10 NOTE — Assessment & Plan Note (Signed)
Summary: PAP SMEAR VISIT   Vital Signs:  Patient profile:   50 year old female Menstrual status:  regular LMP:     05/04/2010  Vitals Entered By: Jennet Maduro RN (May 13, 2010 10:17 AM) CC: PAP smear visit.  Pt. declined condoms.  Pt. given educational materials re:  HIV and women, BSE, heart disease, diet exercise, nutrition, OIs and self-esteem. Is Patient Diabetic? No Pain Assessment Patient in pain? no       Does patient need assistance? Functional Status Self care Ambulation Normal LMP (date): 05/04/2010 LMP - Character: normal     Menstrual Status regular Enter LMP: 05/04/2010 Last PAP Result NEGATIVE FOR INTRAEPITHELIAL LESIONS OR MALIGNANCY.   Evaluation and Follow-Up  Prevention For Positives: 05/13/2010   Safe sex practices discussed with patient. Condoms offered. Prior Medications: TRUVADA 200-300 MG TABS (EMTRICITABINE-TENOFOVIR) once daily KALETRA 200-50 MG TABS (LOPINAVIR-RITONAVIR) Take 2 tablets by mouth twice a day CLARITIN 10 MG  TABS (LORATADINE) Take 1 tablet by mouth once a day CELEXA 20 MG TABS (CITALOPRAM HYDROBROMIDE) Take 1 tablet by mouth once a day ATENOLOL 50 MG TABS (ATENOLOL) Take 1 tablet by mouth once a day Current Allergies: ! SULFA Orders Added: 1)  Est. Patient Level I [[address]] 2)  T-PAP [facility]) [[address]]             Prevention For Positives: 05/13/2010   Safe sex practices discussed with patient. Condoms offered.

## 2011-01-10 NOTE — Miscellaneous (Signed)
Summary: clinical update/ryan white NCADAP approved til 03/11/11  Clinical Lists Changes  Observations: Added new observation of AIDSDAP: Yes 2011 (01/20/2010 16:22)

## 2011-01-10 NOTE — Progress Notes (Signed)
Summary: [place]AD[facility]/pt assist med arrived for Jun via [facility]  Phone Note Refill Request      Prescriptions: KALETRA 200-50 MG TABS (LOPINAVIR-RITONAVIR) Take 2 tablets by mouth twice a day  #120 x 0   Entered by:   Paulo Fruit  BS,CPht II,MPH   Authorized by:   Yisroel Ramming MD   Signed by:   Paulo Fruit  BS,CPht II,MPH on 05/17/2010   Method used:   Samples Given   RxID:   [id number] TRUVADA 200-300 MG TABS (EMTRICITABINE-TENOFOVIR) once daily  #30 x 0   Entered by:   Paulo Fruit  BS,CPht II,MPH   Authorized by:   Yisroel Ramming MD   Signed by:   Paulo Fruit  BS,CPht II,MPH on 05/17/2010   Method used:   Samples Given   RxID:   [id number]  Patient Assist Medication Verification: Medication name: Kaletra 200/50mg  RX # [id number] Tech approval:MLD  Patient Assist Medication Verification: Medication name:Truvada RX #  [id number] Tech approval:MLD Call placed to patient with message that assistance medications are ready for pick-up. Left message on patient's voicemail Paulo Fruit  BS,CPht II,MPH  May 17, 2010 3:03 PM'

## 2011-01-10 NOTE — Progress Notes (Signed)
Summary: NCADAP/pt assist meds arrived for feb  Phone Note Refill Request      Prescriptions: KALETRA 200-50 MG TABS (LOPINAVIR-RITONAVIR) Take 2 tablets by mouth twice a day  #120 x 0   Entered by:   Paulo Fruit  BS,CPht II,MPH   Authorized by:   Yisroel Ramming MD   Signed by:   Paulo Fruit  BS,CPht II,MPH on 01/27/2010   Method used:   Samples Given   RxID:   [id number] TRUVADA 200-300 MG TABS (EMTRICITABINE-TENOFOVIR) once daily  #30 x 0   Entered by:   Paulo Fruit  BS,CPht II,MPH   Authorized by:   Yisroel Ramming MD   Signed by:   Paulo Fruit  BS,CPht II,MPH on 01/27/2010   Method used:   Samples Given   RxID:   [id number]   Patient Assist Medication Verification: Medication: Kaletra 200/50mg  CZY#[id number] Exp Date:17 Oct 2012 Tech approval:MLD                Patient Assist Medication Verification: Medication: Truvada Lot# [id number] Exp Date:07 2014 Tech approval:MLD Call placed to patient with message that assistance medications are ready for pick-up. Left message Grier Mitts  BS,CPht II,MPH  January 27, 2010 2:14 PM

## 2011-01-10 NOTE — Progress Notes (Signed)
Summary: NCADAP/pt assist med arrived for Jul  Phone Note Refill Request      Prescriptions: FLUCONAZOLE 150 MG TABS (FLUCONAZOLE) Take 1 tablet by mouth once daily for 2 days  #2 x 0   Entered by:   Paulo Fruit  BS,CPht II,MPH   Authorized by:   Yisroel Ramming MD   Signed by:   Paulo Fruit  BS,CPht II,MPH on 06/20/2010   Method used:   Samples Given   RxID:   [id number] KALETRA 200-50 MG TABS (LOPINAVIR-RITONAVIR) Take 2 tablets by mouth twice a day  #120 x 0   Entered by:   Paulo Fruit  BS,CPht II,MPH   Authorized by:   Yisroel Ramming MD   Signed by:   Paulo Fruit  BS,CPht II,MPH on 06/20/2010   Method used:   Samples Given   RxID:   [id number] TRUVADA 200-300 MG TABS (EMTRICITABINE-TENOFOVIR) once daily  #30 x 0   Entered by:   Paulo Fruit  BS,CPht II,MPH   Authorized by:   Yisroel Ramming MD   Signed by:   Paulo Fruit  BS,CPht II,MPH on 06/20/2010   Method used:   Samples Given   RxID:   [id number]  Patient Assist Medication Verification: Medication name: Kaletra 200/50mg  RX # [id number] Tech approval:MLD  Patient Assist Medication Verification: Medication name:Truvada RX #  [id number] Tech approval:MLD  Patient Assist Medication Verification: Medication name:fluconazole 150mg  RX # [id number] Tech approval:MLD Call placed to patient with message that assistance medications are ready for pick-up. Paulo Fruit  BS,CPht II,MPH  June 20, 2010 9:24 AM

## 2011-01-10 NOTE — Letter (Signed)
Summary: Juanell Fairly: Income Verification  Juanell Fairly: Income Verification   Imported By: Florinda Marker 01/05/2010 15:29:43  _____________________________________________________________________  External Attachment:    Type:   Image     Comment:   External Document

## 2011-01-10 NOTE — Progress Notes (Signed)
Summary: NCADAP/pt assist meds arrived for APr  Phone Note Refill Request      Prescriptions: KALETRA 200-50 MG TABS (LOPINAVIR-RITONAVIR) Take 2 tablets by mouth twice a day  #120 x 0   Entered by:   Paulo Fruit  BS,CPht II,MPH   Authorized by:   Yisroel Ramming MD   Signed by:   Paulo Fruit  BS,CPht II,MPH on 03/24/2010   Method used:   Samples Given   RxID:   [id number] TRUVADA 200-300 MG TABS (EMTRICITABINE-TENOFOVIR) once daily  #30 x 0   Entered by:   Paulo Fruit  BS,CPht II,MPH   Authorized by:   Yisroel Ramming MD   Signed by:   Paulo Fruit  BS,CPht II,MPH on 03/24/2010   Method used:   Samples Given   RxID:   [id number]   Patient Assist Medication Verification: Medication: Kaletra 200/50mg  BPZ#[id number] Exp Date:04 Aug 2012 Tech approval:MLD                Patient Assist Medication Verification: Medication:Truvada Lot# [id number] Exp Date:06 2014 Tech approval:MLD Call placed to patient with message that assistance medications are ready for pick-up. Left message on VM. Paulo Fruit  BS,CPht II,MPH  March 24, 2010 4:49 PM

## 2011-01-10 NOTE — Progress Notes (Signed)
Summary: ADAP MEDS  Phone Note Other Incoming   Summary of Call: Truvada and Kaletra arrived from [place].  Called and left message for patient advising her the Medicaitons have arrived and she can come and pick them up. Initial call taken by: Altamease Oiler,  September 14, 2010 9:26 AM

## 2011-01-10 NOTE — Progress Notes (Signed)
Summary: NCADAP meds arrived for Aug-Left message for pt to call  Phone Note Refill Request      Prescriptions: KALETRA 200-50 MG TABS (LOPINAVIR-RITONAVIR) Take 2 tablets by mouth twice a day  #120 x 0   Entered by:   Paulo Fruit  BS,CPht II,MPH   Authorized by:   Yisroel Ramming MD   Signed by:   Paulo Fruit  BS,CPht II,MPH on 07/13/2010   Method used:   Samples Given   RxID:   [id number] TRUVADA 200-300 MG TABS (EMTRICITABINE-TENOFOVIR) once daily  #30 x 0   Entered by:   Paulo Fruit  BS,CPht II,MPH   Authorized by:   Yisroel Ramming MD   Signed by:   Paulo Fruit  BS,CPht II,MPH on 07/13/2010   Method used:   Samples Given   RxID:   [id number]  Patient Assist Medication Verification: Medication name: Truvada RX # [id number] Tech approval:MLD  Patient Assist Medication Verification: Medication name:Kaletra 200/50mg  RX # [id number] Tech approval:MLD Call placed to patient with message that assistance medications are ready for pick-up. Left message on patient's phone to call office.  Paulo Fruit  BS,CPht II,MPH  July 13, 2010 4:00 PM

## 2011-01-10 NOTE — Assessment & Plan Note (Signed)
Summary: 2WK F/U/VS   CC:  f/u labs and Depression.  History of Present Illness: Pt feeling well.  No missed doses of her HIV meds. She is trying to get a job and a green card.  Depression History:      The patient is having a depressed mood most of the day and has a diminished interest in her usual daily activities.        Psychosocial stress factors include major life changes.  The patient denies that she feels like life is not worth living, denies that she wishes that she were dead, and denies that she has thought about ending her life.        Comments:  occassional thoughts of wishing she was dead, but not harming herself. .  Preventive Screening-Counseling & Management  Alcohol-Tobacco     Alcohol drinks/day: 0     Smoking Status: never   Updated Prior Medication List: TRUVADA 200-300 MG TABS (EMTRICITABINE-TENOFOVIR) once daily KALETRA 200-50 MG TABS (LOPINAVIR-RITONAVIR) Take 2 tablets by mouth twice a day CLARITIN 10 MG  TABS (LORATADINE) Take 1 tablet by mouth once a day CELEXA 20 MG TABS (CITALOPRAM HYDROBROMIDE) Take 1 tablet by mouth once a day  Current Allergies (reviewed today): ! SULFA Additional History Menstrual Status:  regular  Review of Systems       The patient complains of weight gain.  The patient denies anorexia, fever, and weight loss.    Vital Signs:  Patient profile:   51 year old female Menstrual status:  regular Height:      70 inches (177.80 cm) Weight:      246 pounds (111.82 kg) BMI:     35.42 Temp:     97.6 degrees F (36.44 degrees C) oral Pulse rate:   86 / minute BP sitting:   150 / 94  (left arm)  Vitals Entered By: Starleen Arms CMA (January 12, 2010 10:32 AM) CC: f/u labs, Depression Is Patient Diabetic? No Pain Assessment Patient in pain? no      Nutritional Status BMI of > 30 = obese  Does patient need assistance? Functional Status Self care Ambulation Normal LMP - Character: normal     Menstrual Status  regular Last PAP Result NEGATIVE FOR INTRAEPITHELIAL LESIONS OR MALIGNANCY.   Physical Exam  General:  alert, well-developed, well-nourished, and well-hydrated.   Head:  normocephalic and atraumatic.   Mouth:  pharynx pink and moist.   Lungs:  normal breath sounds.      Impression & Recommendations:  Problem # 1:  HIV INFECTION (ICD-042) Pt.s most recent CD4ct was 1280 and VL 402 .  Pt instructed to continue the current antiretroviral regimen.  Pt encouraged to take medication regularly and not miss doses.  Pt will f/u in 3 months for repeat blood work and will see me 2 weeks later.  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 Patient Level III (99213)Future Orders: T-CD4SP ([facility]) (CD4SP) ... 04/12/2010 T-HIV Viral Load [phone]) ... 04/12/2010 T-Comprehensive Metabolic Panel [phone]) ... 04/12/2010 T-CBC w/Diff ([id number]) ... 04/12/2010 T-RPR (Syphilis) [phone]) ... 04/12/2010  Patient Instructions: 1)  Please schedule a follow-up appointment in 3 months,2 weeks after labs.  Process Orders Check Orders Results:     [facility]: ABN not required for this insurance Tests Sent for requisitioning (January 12, 2010 10:47 AM):     04/12/2010: [facility] -- T-HIV Viral Load [phone] (signed)     04/12/2010: [facility] -- T-Comprehensive Metabolic Panel [[id number]] (signed)     04/12/2010: [facility] -- T-CBC w/Diff [[id number]] (signed)     04/12/2010: [facility] -- T-RPR (Syphilis) [phone] (signed)       Influenza Immunization History:    Influenza # 1:  Historical (08/11/2009)

## 2011-01-10 NOTE — Progress Notes (Signed)
Summary: [facility]/pt assist meds arrived for Jan  Phone Note Refill Request      Prescriptions: KALETRA 200-50 MG TABS (LOPINAVIR-RITONAVIR) Take 2 tablets by mouth twice a day  #120 x 0   Entered by:   Paulo Fruit  BS,CPht II,MPH   Authorized by:   Yisroel Ramming MD   Signed by:   Paulo Fruit  BS,CPht II,MPH on 12/28/2009   Method used:   Samples Given   RxID:   [id number] TRUVADA 200-300 MG TABS (EMTRICITABINE-TENOFOVIR) once daily  #30 x 0   Entered by:   Paulo Fruit  BS,CPht II,MPH   Authorized by:   Yisroel Ramming MD   Signed by:   Paulo Fruit  BS,CPht II,MPH on 12/28/2009   Method used:   Samples Given   RxID:   [id number]   Patient Assist Medication Verification: Medication: Truvada Lot# [id number] Exp Date:05 2014 Tech approval:MLD                Patient Assist Medication Verification: Medication:Kaletra 200/50mg  Lot# [id number] Exp Date:03 Oct 2012 Tech approval:MLD Call placed to patient with message that assistance medications are ready for pick-up. Paulo Fruit  BS,CPht II,MPH  December 28, 2009 3:44 PM                   Appended Document: [place]ADAP/pt assist meds arrived for Jan Prescription/Samples picked up by: patient

## 2011-01-12 NOTE — Assessment & Plan Note (Signed)
Summary: F/U [MKJ]   CC:  follow-up visit, lab results, B/P elevated, has not been taking Atenolol regularly, and c/o depression.  History of Present Illness: patient is here for lab results.  She has not been taking her high blood pressure medication or her antidepressant.  She states she does not like taking lots of medications.  She is very sad.  She states that she is unable to talk to anybody due to stigma surrounding her HIV.  She has been unable to find employment.  She has two masters degrees but is still unable to find a job.  She is concerned that she will not be able to provide for her children.  He is very important for her to have her children educated as to when and are unable to get educated in [place] where she is from. she denies suicidal or homicidal ideation.  She is willing to go into counseling. She also would like to have so me dental work done on her teeth.  She feels this will make her more implantable.  Depression History:      The patient is having a depressed mood most of the day but denies diminished interest in her usual daily activities.        The patient denies that she feels like life is not worth living, denies that she wishes that she were dead, and denies that she has thought about ending her life.        Preventive Screening-Counseling & Management  Alcohol-Tobacco     Alcohol drinks/day: 0     Smoking Status: never  Caffeine-Diet-Exercise     Caffeine use/day: coffee everyday     Does Patient Exercise: yes     Type of exercise: active     Exercise (avg: min/session): >60     Times/week: 7  Safety-Violence-Falls     Seat Belt Use: yes      Sexual History:  n/a.        Drug Use:  never.     Updated Prior Medication List: TRUVADA 200-300 MG TABS (EMTRICITABINE-TENOFOVIR) once daily KALETRA 200-50 MG TABS (LOPINAVIR-RITONAVIR) Take 2 tablets by mouth twice a day CLARITIN 10 MG  TABS (LORATADINE) Take 1 tablet by mouth once a day ATENOLOL 50  MG TABS (ATENOLOL) Take 1 tablet by mouth once a day  Current Allergies (reviewed today): ! SULFA Social History: Sexual History:  n/a Drug Use:  never  Review of Systems  The patient denies anorexia, fever, and weight loss.    Vital Signs:  Patient profile:   50 year old female Menstrual status:  regular Height:      70 inches (177.80 cm) Weight:      250.4 pounds (113.82 kg) BMI:     36.06 Temp:     97.6 degrees F (36.44 degrees C) oral Pulse rate:   73 / minute BP sitting:   156 / 99  (left arm)  Vitals Entered By: Wendall Mola CMA ( AAMA) (December 15, 2010 10:15 AM) CC: follow-up visit, lab results, B/P elevated, has not been taking Atenolol regularly, c/o depression Is Patient Diabetic? No Pain Assessment Patient in pain? no      Nutritional Status BMI of > 30 = obese Nutritional Status Detail appetite "good"  Have you ever been in a relationship where you felt threatened, hurt or afraid?No   Does patient need assistance? Functional Status Self care Ambulation Normal Comments no missed doses of HAART meds per pt.   Physical  Exam  General:  alert, well-developed, well-nourished, and well-hydrated.  tearful Head:  normocephalic and atraumatic.   Mouth:  fair dentition.   Lungs:  normal breath sounds.     Impression & Recommendations:  Problem # 1:  HIV INFECTION (ICD-042) Pt.s most recent CD4ct was 1290  and VL  <20.  Pt instructed to continue the current antiretroviral regimen.  Pt encouraged to take medication regularly and not miss doses.  Pt will f/u in 3 months for repeat blood work and will see me 2 weeks later.  Diagnostics Reviewed:  HIV: CDC-defined AIDS (02/24/2009)   CD4: 1290 (11/04/2010)   WBC: 6.1 (11/02/2010)   Hgb: 13.2 (11/02/2010)   HCT: 39.6 (11/02/2010)   Platelets: 283 (11/02/2010) HIV genotype: REPORT (08/16/2007)   HIV-1 RNA: <20 copies/mL (11/02/2010)   HBSAg: NO (02/04/2007)  Orders: Est. Patient Level IV (99214)Future  Orders: T-CD4SP ([facility]) (CD4SP) ... 06/13/2011 T-HIV Viral Load [id number]) ... 06/13/2011 T-Comprehensive Metabolic Panel [phone]) ... 06/13/2011 T-CBC w/Diff ([id number]) ... 06/13/2011  Problem # 2:  DISORDER, DEPRESSIVE NEC (ICD-311) encouraged patient to get back on celexa refer to [facility] Refer to [facility] counselor The following medications were removed from the medication list:    Celexa 20 Mg Tabs (Citalopram hydrobromide) .Marland Kitchen... Take 1 tablet by mouth once a day Her updated medication list for this problem includes:    Celexa 20 Mg Tabs (Citalopram hydrobromide) .Marland Kitchen... Take 1 tablet by mouth once a day  Problem # 3:  ESSENTIAL HYPERTENSION (ICD-401.9) encouraged to get back on BP medication. Her updated medication list for this problem includes:    Atenolol 50 Mg Tabs (Atenolol) .Marland Kitchen... Take 1 tablet by mouth once a day  Medications Added to Medication List This Visit: 1)  Celexa 20 Mg Tabs (Citalopram hydrobromide) .... Take 1 tablet by mouth once a day  Patient Instructions: 1)  Please schedule a follow-up appointment in 6 months, 2 weeks after labs  Prescriptions: CELEXA 20 MG TABS (CITALOPRAM HYDROBROMIDE) Take 1 tablet by mouth once a day  #30 x 5   Entered and Authorized by:   Yisroel Ramming MD   Signed by:   Yisroel Ramming MD on 12/15/2010   Method used:   Print then Give to Patient   RxID:   [id number] ATENOLOL 50 MG TABS (ATENOLOL) Take 1 tablet by mouth once a day  #30 x 5   Entered and Authorized by:   Yisroel Ramming MD   Signed by:   Yisroel Ramming MD on 12/15/2010   Method used:   Print then Give to Patient   RxID:   [id number]

## 2011-01-12 NOTE — Progress Notes (Signed)
Summary: Picked up NCADAP rxes  Phone Note Outgoing Call   Call placed by: Jennet Maduro RN,  December 08, 2010 3:19 PM Call placed to: Patient Action Taken: Assistance medications ready for pick up Summary of Call: Pt. advised that rxes are here for pick up. Jennet Maduro RN  December 08, 2010 3:21 PM   Follow-up for Phone Call        Picked up rxes.  Jennet Maduro RN  December 13, 2010 8:38 AM     Prescriptions: [place] 200-50 MG TABS (LOPINAVIR-RITONAVIR) Take 2 tablets by mouth twice a day  #120 x 0   Entered by:   Jennet Maduro RN   Authorized by:   Yisroel Ramming MD   Signed by:   Jennet Maduro RN on 12/08/2010   Method used:   Samples Given   RxID:   [id number] TRUVADA 200-300 MG TABS (EMTRICITABINE-TENOFOVIR) once daily  #30 x 0   Entered by:   Jennet Maduro RN   Authorized by:   Yisroel Ramming MD   Signed by:   Jennet Maduro RN on 12/08/2010   Method used:   Samples Given   RxID:   [id number]

## 2011-01-12 NOTE — Assessment & Plan Note (Signed)
Summary: flu shot [mkj]  Prior Medications: TRUVADA 200-300 MG TABS (EMTRICITABINE-TENOFOVIR) once daily KALETRA 200-50 MG TABS (LOPINAVIR-RITONAVIR) Take 2 tablets by mouth twice a day CLARITIN 10 MG  TABS (LORATADINE) Take 1 tablet by mouth once a day CELEXA 20 MG TABS (CITALOPRAM HYDROBROMIDE) Take 1 tablet by mouth once a day ATENOLOL 50 MG TABS (ATENOLOL) Take 1 tablet by mouth once a day FLUCONAZOLE 150 MG TABS (FLUCONAZOLE) Take 1 tablet by mouth once daily for 2 days Current Allergies: ! SULFA Immunizations Administered:  Influenza Vaccine # 1:    Vaccine Type: Fluvax Non-MCR    Site: left deltoid    Mfr: Novartis    Dose: 0.5 ml    Route: IM    Given by: Wendall Mola CMA ( AAMA)    Exp. Date: 03/12/2011    Lot #: [id number]    VIS given: 07/05/10 version given December 08, 2010.  Flu Vaccine Consent Questions:    Do you have a history of severe allergic reactions to this vaccine? no    Any prior history of allergic reactions to egg and/or gelatin? no    Do you have a sensitivity to the preservative Thimersol? no    Do you have a past history of Guillan-Barre Syndrome? no    Do you currently have an acute febrile illness? no    Have you ever had a severe reaction to latex? no    Vaccine information given and explained to patient? yes    Are you currently pregnant? no  Orders Added: 1)  Influenza Vaccine NON MCR [00028]

## 2011-02-06 ENCOUNTER — Telehealth (INDEPENDENT_AMBULATORY_CARE_PROVIDER_SITE_OTHER): Payer: Self-pay | Admitting: *Deleted

## 2011-02-08 ENCOUNTER — Encounter (INDEPENDENT_AMBULATORY_CARE_PROVIDER_SITE_OTHER): Payer: Self-pay | Admitting: *Deleted

## 2011-02-16 NOTE — Miscellaneous (Signed)
Summary: adap/rw update   Clinical Lists Changes  Observations: Added new observation of AIDSDAP: Pending-approval 2012 (02/08/2011 10:13) Added new observation of FINASSESSDT: 02/06/2011 (02/08/2011 10:13)

## 2011-02-16 NOTE — Progress Notes (Signed)
  Phone Note Other Incoming   Summary of Call: ADAP completed by [facility], submitted to [facility].    

## 2011-02-21 LAB — T-HELPER CELL (CD4) - (RCID CLINIC ONLY): CD4 T Cell Abs: 1290 uL (ref 400–2700)

## 2011-02-27 ENCOUNTER — Ambulatory Visit: Payer: Self-pay | Admitting: Adult Health

## 2011-02-28 LAB — T-HELPER CELL (CD4) - (RCID CLINIC ONLY): CD4 T Cell Abs: 1100 uL (ref 400–2700)

## 2011-03-17 LAB — T-HELPER CELL (CD4) - (RCID CLINIC ONLY): CD4 T Cell Abs: 1030 uL (ref 400–2700)

## 2011-03-21 LAB — T-HELPER CELL (CD4) - (RCID CLINIC ONLY): CD4 % Helper T Cell: 37 % (ref 33–55)

## 2011-03-28 LAB — T-HELPER CELL (CD4) - (RCID CLINIC ONLY): CD4 T Cell Abs: 1180 uL (ref 400–2700)

## 2011-04-03 ENCOUNTER — Other Ambulatory Visit: Payer: Self-pay | Admitting: *Deleted

## 2011-04-03 DIAGNOSIS — B20 Human immunodeficiency virus [HIV] disease: Secondary | ICD-10-CM

## 2011-04-03 MED ORDER — LOPINAVIR-RITONAVIR 200-50 MG PO TABS: 2.0000 | ORAL_TABLET | Freq: Two times a day (BID) | ORAL | Status: DC

## 2011-04-03 MED ORDER — EMTRICITABINE-TENOFOVIR DF 200-300 MG PO TABS: 1.0000 | ORAL_TABLET | Freq: Every day | ORAL | Status: DC

## 2011-04-27 ENCOUNTER — Other Ambulatory Visit (INDEPENDENT_AMBULATORY_CARE_PROVIDER_SITE_OTHER): Payer: Self-pay

## 2011-04-27 DIAGNOSIS — B20 Human immunodeficiency virus [HIV] disease: Secondary | ICD-10-CM

## 2011-04-27 LAB — CBC WITH DIFFERENTIAL/PLATELET
Basophils Relative: 1 % (ref 0–1)
Eosinophils Absolute: 0.1 10*3/uL (ref 0.0–0.7)
Eosinophils Relative: 1 % (ref 0–5)
Hemoglobin: 13.5 g/dL (ref 12.0–15.0)
MCH: 28.1 pg (ref 26.0–34.0)
MCHC: 33.1 g/dL (ref 30.0–36.0)
Monocytes Relative: 5 % (ref 3–12)
Neutrophils Relative %: 45 % (ref 43–77)

## 2011-04-28 LAB — COMPLETE METABOLIC PANEL WITH GFR
ALT: 14 U/L (ref 0–35)
CO2: 24 mEq/L (ref 19–32)
Calcium: 9.4 mg/dL (ref 8.4–10.5)
Chloride: 103 mEq/L (ref 96–112)
GFR, Est African American: >60 mL/min (ref >60)
Sodium: 139 mEq/L (ref 135–145)
Total Bilirubin: 0.6 mg/dL (ref 0.3–1.2)
Total Protein: 7.5 g/dL (ref 6.0–8.3)

## 2011-04-28 LAB — T-HELPER CELL (CD4) - (RCID CLINIC ONLY): CD4 T Cell Abs: 1270 uL (ref 400–2700)

## 2011-04-28 LAB — HIV-1 RNA QUANT-NO REFLEX-BLD: HIV-1 RNA Quant, Log: <1.3 {Log} (ref <1.30)

## 2011-04-28 NOTE — Group Therapy Note (Signed)
Angela Crane, BRICKNER NO.:  [id number]   MEDICAL RECORD NO.:  [id number]          PATIENT TYPE:  WOC   LOCATION:  [facility]                   FACILITY:  [facility]   PHYSICIAN:  Tinnie Gens, MD        DATE OF BIRTH:  09-24-1961   DATE OF SERVICE:  12/15/2005                                    CLINIC NOTE   CHIEF COMPLAINT:  Yearly examination.   HISTORY OF PRESENT ILLNESS:  The patient is a 50 year old gravida 3, para 2-  0-1-2 who is self-referred for her annual examination with Pap smear.  It  has been approximately one year since her last examination.  She is without  significant complaints today.  She is no longer sexually active and not on  any birth control currently.   PAST MEDICAL HISTORY:  Significant for HIV positivity.  She is followed in  ID Clinic by Dr. Burnice Logan.   PAST SURGICAL HISTORY:  Negative.   MEDICATIONS:  1.  Kaletra two p.o. b.i.d.  2.  Truvada one p.o. daily.   ALLERGIES:  SULFA.   OB HISTORY:  G3, P2 with two vaginal deliveries.   GYN HISTORY:  Patient continues to have regular monthly cycles approximately  one a month, regular flow.  No significant pain associated with it.   FAMILY HISTORY:  She has diabetes in a brother, but ____________ surgical  removal of a pancreas and hypertension in her mother.  She is from [place]  and does not know her family history well.   SOCIAL HISTORY:  She denies tobacco, alcohol, or drug use.   REVIEW OF SYSTEMS:  14-point review of systems is reviewed.  Please see GYN  history in the chart, but is negative.   PHYSICAL EXAMINATION:  VITAL SIGNS:  Blood pressure 116/76, weight 218.  GENERAL:  She is a moderate obese black female in no acute distress.  HEENT:  Normocephalic, atraumatic.  Sclerae anicteric.  NECK:  Supple, normal thyroid.  ABDOMEN:  Soft, nontender, nondistended.  BREASTS:  Symmetric with everted nipples.  There is diffuse bilateral  fibrocystic change noted.  There is no  supraclavicular or axillary  adenopathy.  GENITOURINARY:  Normal external female genitalia.  BUS is normal.  The  vagina is pink and rugated.  It appears she is starting her menses.  The  uterus is small, anteverted, and flexed, nontender.  Adnexa were without  mass or tenderness.  Cervix is visualized and without lesion.  EXTREMITIES:  No clubbing, cyanosis, edema.  She has 2+ distal pulses.  She  has a bunion defect on her left toe.   IMPRESSION:  1.  Yearly examination.  2.  HIV disease.   PLAN:  1.  Pap smear today.  2.  Will follow up with yearly Paps if this is normal.  3.  Mammogram is scheduled for next week.  Other medical conditions will      continue to be followed by Dr. Burnice Logan.           ______________________________  Tinnie Gens, MD     TP/MEDQ  D:  12/15/2005  T:  12/15/2005  Job:  [id number]   cc:   ID Clinic

## 2011-04-28 NOTE — Group Therapy Note (Signed)
NAME:  Angela Crane, Angela Crane NO.:  [id number]   MEDICAL RECORD NO.:  [id number]          PATIENT TYPE:  WOC   LOCATION:  [facility]                   FACILITY:  [facility]   PHYSICIAN:  Allie Bossier, MD        DATE OF BIRTH:  1961-03-16   DATE OF SERVICE:  12/14/2006                                  CLINIC NOTE   ANNUAL EXAM   The patient is a 50 year old black female, gravida 3, para 2, A 1, who  has been living with HIV for the last four years.  This is under good  control, but she does complain of depression.  After a lengthy  discussion, she is willing to be treated.  She has no GYN complaints.  Her periods are monthly, painless.  She is not having menopausal  symptoms and she is not sexually active.   PAST MEDICAL HISTORY:  HIV.  She is followed in the ID Clinic and  reports herself to be under good control.   PAST SURGICAL HISTORY:  Negative.   MEDICATIONS:  She takes Kaletra two pills b.i.d., Truvada one p.o.  daily.  She takes over-the-counter pain medicines as necessary.  This is  rare.  She takes a multivitamin daily.   ALLERGIES:  SULFA.   REVIEW OF SYSTEMS:  Periods are regular and nonpainful.  She is not  sexually active.  Her mammogram is scheduled for 12/26/2006.   FAMILY HISTORY:  Noncontributory.   SOCIAL HISTORY:  Negative tobacco, alcohol or drug use.   PHYSICAL EXAMINATION:  VITALS:  Weight 230 pounds, blood pressure  143/83, pulse 75.  HEENT:  Normal.  HEART:  Regular rate and rhythm.  LUNGS:  Clear to auscultation bilaterally.  ABDOMEN:  Benign.  BREASTS:  Normal.  CHEST:  She does have scarring on her chest from shingles which happened  this year.  External genitalia, no lesions and normal.  Cervix normal.  Pap smear  obtained.  Uterus is normal size and shape, anteverted, nontender, not  enlarged.  No adnexal masses.   ASSESSMENT AND PLAN:  1. Annual exam.  Her Pap smear has been obtained.  Mammogram is      scheduled.  2.  Depression.  I have spoken with the PharmD and we have opted to      give her Celexa 20 mg daily.  This is also      on [employer]'s $4.00 generic policy, which is the patient's request.      She will follow up in four weeks to see if her depression is      somewhat improved.      Allie Bossier, MD     MCD/MEDQ  D:  12/14/2006  T:  12/14/2006  Job:  [id number]

## 2011-05-11 ENCOUNTER — Encounter: Payer: Self-pay | Admitting: Adult Health

## 2011-05-11 ENCOUNTER — Ambulatory Visit (INDEPENDENT_AMBULATORY_CARE_PROVIDER_SITE_OTHER): Payer: Self-pay | Admitting: Adult Health

## 2011-05-11 DIAGNOSIS — E785 Hyperlipidemia, unspecified: Secondary | ICD-10-CM | POA: Insufficient documentation

## 2011-05-11 DIAGNOSIS — B20 Human immunodeficiency virus [HIV] disease: Secondary | ICD-10-CM

## 2011-05-11 MED ORDER — RALTEGRAVIR POTASSIUM 400 MG PO TABS: 400.0000 mg | ORAL_TABLET | Freq: Two times a day (BID) | ORAL | Status: DC

## 2011-05-11 NOTE — Progress Notes (Signed)
  Subjective:    Patient ID: Angela Crane, female    DOB: 10-03-1961, 50 y.o.   MRN: [id number]  HPI Presents to clinic for routine scheduled followup. Voices no physical complaints. Expresses. No problems at present. Remains adherent to her medications with good tolerance and no complications.   Review of Systems  Constitutional: Negative.   HENT: Negative.   Eyes: Negative.   Respiratory: Negative.   Cardiovascular: Negative.   Gastrointestinal: Negative.   Genitourinary: Negative.   Musculoskeletal: Negative.   Skin: Negative.   Neurological: Negative.   Hematological: Negative.   Psychiatric/Behavioral: Negative.        Objective:   Physical Exam  Constitutional: She is oriented to person, place, and time. She appears well-developed.       Obese appearing  HENT:  Head: Normocephalic and atraumatic.  Eyes: Conjunctivae and EOM are normal. Pupils are equal, round, and reactive to light.  Neck: Normal range of motion. Neck supple.  Cardiovascular: Normal rate and regular rhythm.   Pulmonary/Chest: Effort normal and breath sounds normal.  Abdominal: Soft. Bowel sounds are normal.  Musculoskeletal: Normal range of motion.  Neurological: She is alert and oriented to person, place, and time. She has normal reflexes.  Skin: Skin is warm and dry.  Psychiatric: She has a normal mood and affect. Her behavior is normal. Judgment and thought content normal.          Assessment & Plan:  1. HIV. Labs obtained 04/27/2011 show a CD4 count of 1270 at 42% with a viral load of less than 20 copies per mL. From an HIV perspective. She is clinically stable on her current regimen. However, a review of her last set of labs demonstrate significant abnormalities in both total cholesterol, triglycerides, and HDL. As she has been on this regimen for an extended period of time, it is possible that her PI, therapy is impacting, metabolic function, and contributing to metabolic disease. Therefore, we  will discontinue her Kaletra and begin Isentress 400 mg by mouth twice a day. She was instructed to complete her month's supply of her current regimen, and, when she has her medications refilled, she will start her Isentress. Drug effects, side effects, regimen, dosing, potential adverse drug reactions, and toxicities were discussed in detail.  2. Dyslipidemia. Labs from November 2011 show a total cholesterol of 252 triglycerides 241, HDL of 41, LDL of 163, and his VLDL of 48. We discussed measures to improve cholesterol including a low fat. Carbohydrate restricted diet and increased physical activity. We also discussed treatment changes from Kaletra to Isentress for safety reasons. If after being on Isentress there shows a consistent pattern of metabolic abnormalities. We may want to consider adding cholesterol-lowering agent to her regimen.  She verbally acknowledged all information that was provided for her and agreed with plan of care.

## 2011-06-26 ENCOUNTER — Other Ambulatory Visit: Payer: Self-pay | Admitting: Infectious Diseases

## 2011-06-26 ENCOUNTER — Other Ambulatory Visit (INDEPENDENT_AMBULATORY_CARE_PROVIDER_SITE_OTHER): Payer: Self-pay

## 2011-06-26 DIAGNOSIS — Z79899 Other long term (current) drug therapy: Secondary | ICD-10-CM

## 2011-06-26 DIAGNOSIS — E785 Hyperlipidemia, unspecified: Secondary | ICD-10-CM

## 2011-06-26 DIAGNOSIS — B20 Human immunodeficiency virus [HIV] disease: Secondary | ICD-10-CM

## 2011-06-26 LAB — CBC WITH DIFFERENTIAL/PLATELET
Basophils Absolute: 0 10*3/uL (ref 0.0–0.1)
Basophils Relative: 0 % (ref 0–1)
Eosinophils Absolute: 0.1 10*3/uL (ref 0.0–0.7)
MCH: 28.1 pg (ref 26.0–34.0)
MCHC: 33 g/dL (ref 30.0–36.0)
Neutro Abs: 2.5 10*3/uL (ref 1.7–7.7)
Neutrophils Relative %: 46 % (ref 43–77)
RDW: 14.7 % (ref 11.5–15.5)

## 2011-06-26 LAB — LIPID PANEL
Cholesterol: 171 mg/dL (ref 0–200)
HDL: 37 mg/dL — ABNORMAL LOW (ref >39)
LDL Cholesterol: 105 mg/dL — ABNORMAL HIGH (ref 0–99)
Triglycerides: 146 mg/dL (ref <150)

## 2011-06-27 LAB — COMPLETE METABOLIC PANEL WITH GFR
AST: 28 U/L (ref 0–37)
Albumin: 4.4 g/dL (ref 3.5–5.2)
Alkaline Phosphatase: 69 U/L (ref 39–117)
BUN: 9 mg/dL (ref 6–23)
GFR, Est Non African American: >60 mL/min (ref >60)
Glucose, Bld: 87 mg/dL (ref 70–99)
Potassium: 4.4 mEq/L (ref 3.5–5.3)
Sodium: 139 mEq/L (ref 135–145)
Total Bilirubin: 0.3 mg/dL (ref 0.3–1.2)
Total Protein: 7.7 g/dL (ref 6.0–8.3)

## 2011-06-27 LAB — HIV-1 RNA QUANT-NO REFLEX-BLD: HIV 1 RNA Quant: <20 copies/mL (ref <20)

## 2011-07-11 ENCOUNTER — Ambulatory Visit (INDEPENDENT_AMBULATORY_CARE_PROVIDER_SITE_OTHER): Payer: Self-pay | Admitting: Adult Health

## 2011-07-11 ENCOUNTER — Encounter: Payer: Self-pay | Admitting: Adult Health

## 2011-07-11 DIAGNOSIS — B20 Human immunodeficiency virus [HIV] disease: Secondary | ICD-10-CM

## 2011-07-11 DIAGNOSIS — Z113 Encounter for screening for infections with a predominantly sexual mode of transmission: Secondary | ICD-10-CM

## 2011-07-11 DIAGNOSIS — E785 Hyperlipidemia, unspecified: Secondary | ICD-10-CM

## 2011-07-11 DIAGNOSIS — Z79899 Other long term (current) drug therapy: Secondary | ICD-10-CM

## 2011-07-11 NOTE — Progress Notes (Signed)
Subjective:    Patient ID: Angela Crane, female    DOB: 26-Jun-1961, 50 y.o.   MRN: [id number]  HPI Presents to clinic for scheduled followup after start of new medication regimen. States for the first 3 weeks on her medications, she developed muscle pain in her arms, and her legs. She states originally she did not feel well, but states currently, she is feeling better and seems to be tolerating. Her medications. She does relate 100%. Adherence to these medications. Also requesting referral for Pap smear and mammogram.   Review of Systems  Constitutional: Positive for activity change and fatigue. Negative for fever, chills, diaphoresis, appetite change and unexpected weight change.       Generalized malaise for the first 2 weeks while on her new medical regimen  HENT: Negative.   Eyes: Negative.   Respiratory: Negative.   Cardiovascular: Negative.   Gastrointestinal: Negative.   Genitourinary: Negative.   Musculoskeletal: Positive for myalgias.  Skin: Negative.   Neurological: Negative.   Hematological: Negative.   Psychiatric/Behavioral: Negative.        Objective:   Physical Exam  Constitutional: She is oriented to person, place, and time. She appears well-developed. No distress.       Obese  HENT:  Head: Normocephalic and atraumatic.  Right Ear: External ear normal.  Left Ear: External ear normal.  Nose: Nose normal.  Mouth/Throat: Oropharynx is clear and moist.  Eyes: Conjunctivae and EOM are normal. Pupils are equal, round, and reactive to light. No scleral icterus.  Neck: Normal range of motion. Neck supple.  Cardiovascular: Normal rate, regular rhythm, normal heart sounds and intact distal pulses.   Pulmonary/Chest: Effort normal and breath sounds normal.  Abdominal: Soft. Bowel sounds are normal.  Musculoskeletal: Normal range of motion. She exhibits no edema and no tenderness.  Neurological: She is alert and oriented to person, place, and time. She has normal  reflexes. No cranial nerve deficit. She exhibits normal muscle tone. Coordination normal.  Skin: Skin is warm and dry.  Psychiatric: She has a normal mood and affect. Her behavior is normal. Judgment and thought content normal.          Assessment & Plan:   No problem-specific assessment & plan notes found for this encounter.  1. HIV. From labs a retained 06/26/2011. Her CD4 count was 1210 at 41% with a viral load of less than 20 copies per mL. Initially, had some adjustments symptoms, but this appears to be dissipating, and she is tolerating. Her regimen, better. Recommend continuing present management. For now, and following up again in 4 months with repeat fasting labs 2 weeks before next appointment.  2. Dyslipidemia. Lipid panel obtained 06/26/2011, shows remarkable improvement in almost all indices, with a total cholesterol, of 171 mg/dL, down from 409, triglycerides 146 mg/dL, down from 811, HDL was 37 mg/dL, which is so close to what she was previously. Her LDL calculated was 105 mg/dL, which is down from 914, and her VLDL was 29 mg/dL, which is down from 48. It is apparent that there has been some significant improvements metabolically since changing her antiretroviral regimen. No further interventions. At this time. Low-fat carver, strict a diet was reviewed again.  3. Routine Health Maintenance. 2 schedule a Pap smear with Angela Crane, and was instructed to contact the women's health program for [facility] to schedule her annual mammogram. She was instructed should they request or require a referral, she should contact clinic and we will provide that for her.  She verbally acknowledged all information that was provided to her and agreed with plan of care.

## 2011-07-11 NOTE — Patient Instructions (Signed)
1. Please schedule routine Pap smear with Mrs. Angela Crane. 2. Contact women's health to schedule annual mammogram.

## 2011-09-01 ENCOUNTER — Encounter: Payer: Self-pay | Admitting: *Deleted

## 2011-09-01 ENCOUNTER — Encounter: Payer: Self-pay | Admitting: Adult Health

## 2011-09-01 ENCOUNTER — Ambulatory Visit (INDEPENDENT_AMBULATORY_CARE_PROVIDER_SITE_OTHER): Payer: Self-pay | Admitting: Adult Health

## 2011-09-01 VITALS — BP 137/82 | HR 71 | Temp 98.6°F | Wt 252.0 lb

## 2011-09-01 DIAGNOSIS — I10 Essential (primary) hypertension: Secondary | ICD-10-CM

## 2011-09-01 DIAGNOSIS — Z0000 Encounter for general adult medical examination without abnormal findings: Secondary | ICD-10-CM

## 2011-09-01 DIAGNOSIS — F329 Major depressive disorder, single episode, unspecified: Secondary | ICD-10-CM

## 2011-09-01 DIAGNOSIS — Z23 Encounter for immunization: Secondary | ICD-10-CM

## 2011-09-01 DIAGNOSIS — B20 Human immunodeficiency virus [HIV] disease: Secondary | ICD-10-CM

## 2011-09-01 DIAGNOSIS — F3289 Other specified depressive episodes: Secondary | ICD-10-CM

## 2011-09-01 MED ORDER — AMLODIPINE BESYLATE 10 MG PO TABS: 10.0000 mg | ORAL_TABLET | Freq: Every day | ORAL | Status: DC

## 2011-09-01 MED ORDER — HYDROCHLOROTHIAZIDE 25 MG PO TABS: 25.0000 mg | ORAL_TABLET | Freq: Every day | ORAL | Status: DC

## 2011-09-05 LAB — T-HELPER CELL (CD4) - (RCID CLINIC ONLY): CD4 T Cell Abs: 1160

## 2011-09-11 LAB — T-HELPER CELL (CD4) - (RCID CLINIC ONLY): CD4 % Helper T Cell: 30 — ABNORMAL LOW

## 2011-09-22 LAB — T-HELPER CELL (CD4) - (RCID CLINIC ONLY): CD4 T Cell Abs: 930

## 2011-10-02 NOTE — Progress Notes (Signed)
  Subjective:    Patient ID: Angela Crane, female    DOB: 04/21/1961, 50 y.o.   MRN: [id number]  HPI Presents with routine scheduled followup. Endorses adherence to her medications with good tolerance and no complications.   Review of Systems  Constitutional: Negative.   HENT: Negative.   Eyes: Negative.   Respiratory: Negative.   Cardiovascular: Negative.   Gastrointestinal: Negative.   Genitourinary: Negative.   Musculoskeletal: Negative.   Skin: Negative.   Neurological: Negative.   Hematological: Negative.   Psychiatric/Behavioral: Negative.        Objective:   Physical Exam  Constitutional: She is oriented to person, place, and time. She appears well-developed and well-nourished. No distress.  HENT:  Head: Normocephalic and atraumatic.  Nose: Nose normal.  Mouth/Throat: Oropharynx is clear and moist.  Eyes: Conjunctivae and EOM are normal. Pupils are equal, round, and reactive to light.  Neck: Normal range of motion. Neck supple.  Cardiovascular: Normal rate, regular rhythm, normal heart sounds and intact distal pulses.   Pulmonary/Chest: Effort normal and breath sounds normal.  Abdominal: Soft. Bowel sounds are normal.  Musculoskeletal: Normal range of motion.  Neurological: She is alert and oriented to person, place, and time. No cranial nerve deficit. She exhibits normal muscle tone. Coordination normal.  Skin: Skin is warm and dry. No erythema.  Psychiatric: She has a normal mood and affect. Her behavior is normal. Judgment and thought content normal.          Assessment & Plan:  1. HIV. Labs obtained 06/26/2011. Show a CD4 count of 1210 at 41% with a viral load less than 20 copies per mL. Clinically stable on current regimen. Recommend continuing present management, repeating labs in 10 weeks with a followup in 3 months. Note for school indicating. Her attendance to clinic today was provided for her.  She verbally acknowledged all information was provided to  her and agreed with plan of care.

## 2011-10-06 ENCOUNTER — Other Ambulatory Visit: Payer: Self-pay | Admitting: Infectious Diseases

## 2011-10-06 DIAGNOSIS — Z124 Encounter for screening for malignant neoplasm of cervix: Secondary | ICD-10-CM

## 2011-10-06 NOTE — Patient Instructions (Signed)
  Your results will be ready in about a week.  I will mail them to you.  Thank you for coming to the [facility] for your care.  Denise 

## 2011-10-11 ENCOUNTER — Encounter: Payer: Self-pay | Admitting: *Deleted

## 2011-10-23 ENCOUNTER — Other Ambulatory Visit (INDEPENDENT_AMBULATORY_CARE_PROVIDER_SITE_OTHER): Payer: Self-pay

## 2011-10-23 ENCOUNTER — Other Ambulatory Visit: Payer: Self-pay | Admitting: Infectious Diseases

## 2011-10-23 DIAGNOSIS — Z113 Encounter for screening for infections with a predominantly sexual mode of transmission: Secondary | ICD-10-CM

## 2011-10-23 DIAGNOSIS — E785 Hyperlipidemia, unspecified: Secondary | ICD-10-CM

## 2011-10-23 DIAGNOSIS — B20 Human immunodeficiency virus [HIV] disease: Secondary | ICD-10-CM

## 2011-10-24 LAB — CBC WITH DIFFERENTIAL/PLATELET
Basophils Absolute: 0 10*3/uL (ref 0.0–0.1)
Basophils Relative: 0 % (ref 0–1)
Eosinophils Absolute: 0.1 10*3/uL (ref 0.0–0.7)
Eosinophils Relative: 2 % (ref 0–5)
HCT: 40 % (ref 36.0–46.0)
Hemoglobin: 13.5 g/dL (ref 12.0–15.0)
Lymphocytes Relative: 57 % — ABNORMAL HIGH (ref 12–46)
Lymphs Abs: 3.4 10*3/uL (ref 0.7–4.0)
MCH: 27.8 pg (ref 26.0–34.0)
MCHC: 33.8 g/dL (ref 30.0–36.0)
MCV: 82.5 fL (ref 78.0–100.0)
Monocytes Absolute: 0.3 10*3/uL (ref 0.1–1.0)
Monocytes Relative: 5 % (ref 3–12)
Neutro Abs: 2.1 10*3/uL (ref 1.7–7.7)
Neutrophils Relative %: 36 % — ABNORMAL LOW (ref 43–77)
Platelets: 291 10*3/uL (ref 150–400)
RBC: 4.85 MIL/uL (ref 3.87–5.11)
RDW: 15 % (ref 11.5–15.5)
WBC: 5.9 10*3/uL (ref 4.0–10.5)

## 2011-10-24 LAB — COMPLETE METABOLIC PANEL WITH GFR
ALT: 26 U/L (ref 0–35)
AST: 33 U/L (ref 0–37)
Albumin: 4 g/dL (ref 3.5–5.2)
Alkaline Phosphatase: 62 U/L (ref 39–117)
BUN: 9 mg/dL (ref 6–23)
CO2: 23 mEq/L (ref 19–32)
Calcium: 8.9 mg/dL (ref 8.4–10.5)
Chloride: 105 mEq/L (ref 96–112)
Creat: 0.77 mg/dL (ref 0.50–1.10)
GFR, Est African American: >89 mL/min/{1.73_m2}
GFR, Est Non African American: >89 mL/min/{1.73_m2}
Glucose, Bld: 87 mg/dL (ref 70–99)
Potassium: 4.6 mEq/L (ref 3.5–5.3)
Sodium: 136 mEq/L (ref 135–145)
Total Bilirubin: 0.3 mg/dL (ref 0.3–1.2)
Total Protein: 7 g/dL (ref 6.0–8.3)

## 2011-10-24 LAB — RPR

## 2011-10-24 LAB — T-HELPER CELL (CD4) - (RCID CLINIC ONLY)
CD4 % Helper T Cell: 43 % (ref 33–55)
CD4 T Cell Abs: 1500 uL (ref 400–2700)

## 2011-10-24 LAB — LIPID PANEL: Total CHOL/HDL Ratio: 3.8 Ratio

## 2011-11-06 ENCOUNTER — Ambulatory Visit: Payer: Self-pay | Admitting: Adult Health

## 2011-11-08 ENCOUNTER — Ambulatory Visit (INDEPENDENT_AMBULATORY_CARE_PROVIDER_SITE_OTHER): Payer: Self-pay | Admitting: Internal Medicine

## 2011-11-08 ENCOUNTER — Encounter: Payer: Self-pay | Admitting: Internal Medicine

## 2011-11-08 VITALS — BP 145/91 | HR 81 | Temp 98.8°F | Ht 70.0 in | Wt 260.8 lb

## 2011-11-08 DIAGNOSIS — E669 Obesity, unspecified: Secondary | ICD-10-CM | POA: Insufficient documentation

## 2011-11-08 DIAGNOSIS — B20 Human immunodeficiency virus [HIV] disease: Secondary | ICD-10-CM

## 2011-11-08 DIAGNOSIS — I10 Essential (primary) hypertension: Secondary | ICD-10-CM

## 2011-11-08 NOTE — Assessment & Plan Note (Signed)
Her infection remains under excellent control. I will continue her current regimen.

## 2011-11-08 NOTE — Assessment & Plan Note (Signed)
Her blood pressure is not at goal today. It has been fairly variable over the past year. It sounds like she is adherent to her medications. Her recent weight gain may certainly contribute to her elevated blood pressure today. She is interested in attending [facility] lifestyle classes to try to gain better control of her weight and consider dietary modifications. I have asked her to buy a blood pressure monitor and keep track of her blood pressure at home so that it can be reviewed at the time of her next visit. She may need a change in her antihypertensive regimen.

## 2011-11-08 NOTE — Assessment & Plan Note (Signed)
I'm not convinced that her recent weight gain is due solely to the switch to Isentress. I will continue it for now and have her attend the lifestyle modification class.

## 2011-11-08 NOTE — Progress Notes (Signed)
  Subjective:    Patient ID: Angela Crane, female    DOB: 08/01/1961, 50 y.o.   MRN: [id number]  HPI Angela Crane is in for her routine visit. She denies missing any of her medications since her last visit. She has been concerned that she has been gaining weight since she was changed from Kaletra to Isentress in April. She has not noticed any change in her appetite and was not having any problem with diarrhea while on the Kaletra. She states that she tries to remain active particularly chasing after her 3 grandsons. She states that there has not been any change in what she eats or how much she eats. She states that she feels uncomfortable at her current weight.    Review of Systems     Objective:   Physical Exam  Constitutional: No distress.       Her weight is up 10 pounds since January.  HENT:  Mouth/Throat: Oropharynx is clear and moist. No oropharyngeal exudate.  Cardiovascular: Normal rate, regular rhythm and normal heart sounds.   No murmur heard. Pulmonary/Chest: Breath sounds normal. She has no wheezes. She has no rales.  Skin: No rash noted.  Psychiatric: She has a normal mood and affect.   HIV 1 RNA Quant (copies/mL)  Date Value  10/23/2011 NOT DETECTED   06/26/2011 <20   04/27/2011 <20      CD4 T Cell Abs (cmm)  Date Value  10/23/2011 1500   06/26/2011 1210   04/27/2011 1270              Assessment & Plan:

## 2011-11-17 ENCOUNTER — Other Ambulatory Visit: Payer: Self-pay | Admitting: *Deleted

## 2011-11-17 DIAGNOSIS — B20 Human immunodeficiency virus [HIV] disease: Secondary | ICD-10-CM

## 2011-11-17 MED ORDER — EMTRICITABINE-TENOFOVIR DF 200-300 MG PO TABS: 1.0000 | ORAL_TABLET | Freq: Every day | ORAL | Status: DC

## 2011-11-17 MED ORDER — RALTEGRAVIR POTASSIUM 400 MG PO TABS: 400.0000 mg | ORAL_TABLET | Freq: Two times a day (BID) | ORAL | Status: DC

## 2011-11-20 ENCOUNTER — Other Ambulatory Visit: Payer: Self-pay | Admitting: *Deleted

## 2011-11-20 DIAGNOSIS — I10 Essential (primary) hypertension: Secondary | ICD-10-CM

## 2011-11-20 MED ORDER — HYDROCHLOROTHIAZIDE 25 MG PO TABS: 25.0000 mg | ORAL_TABLET | Freq: Every day | ORAL | Status: DC

## 2011-11-20 MED ORDER — AMLODIPINE BESYLATE 10 MG PO TABS: 10.0000 mg | ORAL_TABLET | Freq: Every day | ORAL | Status: DC

## 2011-11-20 MED ORDER — AMLODIPINE BESYLATE 10 MG PO TABS
10.0000 mg | ORAL_TABLET | Freq: Every day | ORAL | Status: DC
Start: 2011-11-20 — End: 2011-11-20

## 2011-11-23 ENCOUNTER — Other Ambulatory Visit: Payer: Self-pay | Admitting: Internal Medicine

## 2011-11-23 DIAGNOSIS — E785 Hyperlipidemia, unspecified: Secondary | ICD-10-CM

## 2011-11-23 DIAGNOSIS — E669 Obesity, unspecified: Secondary | ICD-10-CM

## 2012-01-30 ENCOUNTER — Other Ambulatory Visit (INDEPENDENT_AMBULATORY_CARE_PROVIDER_SITE_OTHER): Payer: Self-pay

## 2012-01-30 DIAGNOSIS — Z79899 Other long term (current) drug therapy: Secondary | ICD-10-CM

## 2012-01-30 DIAGNOSIS — B20 Human immunodeficiency virus [HIV] disease: Secondary | ICD-10-CM

## 2012-01-30 DIAGNOSIS — Z113 Encounter for screening for infections with a predominantly sexual mode of transmission: Secondary | ICD-10-CM

## 2012-01-30 LAB — COMPLETE METABOLIC PANEL WITH GFR
AST: 31 U/L (ref 0–37)
Alkaline Phosphatase: 70 U/L (ref 39–117)
GFR, Est Non African American: 82 mL/min
Glucose, Bld: 95 mg/dL (ref 70–99)
Sodium: 138 mEq/L (ref 135–145)
Total Bilirubin: 0.4 mg/dL (ref 0.3–1.2)
Total Protein: 7.3 g/dL (ref 6.0–8.3)

## 2012-01-30 LAB — CBC
Hemoglobin: 13.7 g/dL (ref 12.0–15.0)
MCH: 28.4 pg (ref 26.0–34.0)
MCHC: 34.3 g/dL (ref 30.0–36.0)
RDW: 14.6 % (ref 11.5–15.5)

## 2012-01-30 LAB — LIPID PANEL
Cholesterol: 155 mg/dL (ref 0–200)
HDL: 45 mg/dL (ref >39)
Total CHOL/HDL Ratio: 3.4 Ratio
Triglycerides: 126 mg/dL (ref <150)

## 2012-01-30 LAB — URINALYSIS, ROUTINE W REFLEX MICROSCOPIC
Glucose, UA: NEGATIVE mg/dL
Leukocytes, UA: NEGATIVE
Nitrite: NEGATIVE
Specific Gravity, Urine: 1.023 (ref 1.005–1.030)
pH: 7 (ref 5.0–8.0)

## 2012-01-31 LAB — GC/CHLAMYDIA PROBE AMP, URINE
Chlamydia, Swab/Urine, PCR: NEGATIVE
GC Probe Amp, Urine: NEGATIVE

## 2012-02-01 LAB — HIV-1 RNA QUANT-NO REFLEX-BLD: HIV 1 RNA Quant: <20 copies/mL (ref <20)

## 2012-02-13 ENCOUNTER — Ambulatory Visit (INDEPENDENT_AMBULATORY_CARE_PROVIDER_SITE_OTHER): Payer: Self-pay | Admitting: Internal Medicine

## 2012-02-13 ENCOUNTER — Encounter: Payer: Self-pay | Admitting: *Deleted

## 2012-02-13 ENCOUNTER — Encounter: Payer: Self-pay | Admitting: Internal Medicine

## 2012-02-13 VITALS — BP 134/90 | HR 86 | Temp 98.2°F | Ht 70.0 in | Wt 263.5 lb

## 2012-02-13 DIAGNOSIS — B20 Human immunodeficiency virus [HIV] disease: Secondary | ICD-10-CM

## 2012-02-13 NOTE — Progress Notes (Signed)
Patient ID: Angela Crane, female   DOB: [birth year], 51 y.o.   MRN: [id number]  INFECTIOUS DISEASE PROGRESS NOTE    Subjective: Angela Crane is in for her routine visit. She was not able to get transportation to the lifestyle modification classes. She has not made any changes to her diet; she does use salt fairly liberally and is just now beginning regular exercise with a DVD program she bought. She is intermittently sad and feels that having someone to talk to would help.  Objective: Temp: 98.2 F (36.8 C) (03/05 0908) Temp src: Oral (03/05 0908) BP: 134/90 mmHg (03/05 0908) Pulse Rate: 86  (03/05 0908)  General: wt up 3 lbs and BMI 37.8. Tearful during the exam Skin: no rash Lungs: clear Cor: reg S1 and S2 with no murmurs Abdomen: soft, obes and nontender   Lab Results HIV 1 RNA Quant (copies/mL)  Date Value  01/30/2012 <20   10/23/2011 NOT DETECTED   06/26/2011 <20      CD4 T Cell Abs (cmm)  Date Value  01/30/2012 1160   10/23/2011 1500   06/26/2011 1210      Assessment: Her HIV is under excellent control.  Her BP and Wt are not at goal and I talked to her about lifestyle modification, the DASH diet, salt restriction and exercise.  Her depression is still active. I will refer her to [facility].  Plan: 1. Continue current meds 2. MH counseling 3. Lifestyle modification 4. Return after labs in 3 months   Cliffton Asters, MD [facility] for Infectious Diseases [facility] [phone] pager   [phone] cell 02/13/2012, 9:30 AM

## 2012-02-13 NOTE — Progress Notes (Signed)
Patient ID: Angela Crane, female   DOB: 05-Nov-1961, 51 y.o.   MRN: [id number]  Per Dr. Orvan Falconer referral paperwork completed and placed in Mental Health Counselor's box.  Pt given card to call [facility] to arrange appt.  Copy of paperwork given to Referral Coordinator.

## 2012-05-13 ENCOUNTER — Other Ambulatory Visit (INDEPENDENT_AMBULATORY_CARE_PROVIDER_SITE_OTHER): Payer: Self-pay

## 2012-05-13 DIAGNOSIS — B20 Human immunodeficiency virus [HIV] disease: Secondary | ICD-10-CM

## 2012-05-13 LAB — COMPLETE METABOLIC PANEL WITH GFR
ALT: 26 U/L (ref 0–35)
AST: 30 U/L (ref 0–37)
CO2: 27 mEq/L (ref 19–32)
Calcium: 9.2 mg/dL (ref 8.4–10.5)
Chloride: 104 mEq/L (ref 96–112)
Creat: 0.86 mg/dL (ref 0.50–1.10)
GFR, Est African American: >89 mL/min
Sodium: 140 mEq/L (ref 135–145)
Total Bilirubin: 0.5 mg/dL (ref 0.3–1.2)
Total Protein: 7.3 g/dL (ref 6.0–8.3)

## 2012-05-14 LAB — HIV-1 RNA QUANT-NO REFLEX-BLD
HIV 1 RNA Quant: <20 copies/mL (ref <20)
HIV-1 RNA Quant, Log: <1.3 {Log} (ref <1.30)

## 2012-05-14 LAB — T-HELPER CELL (CD4) - (RCID CLINIC ONLY): CD4 % Helper T Cell: 42 % (ref 33–55)

## 2012-05-28 ENCOUNTER — Ambulatory Visit: Payer: Self-pay | Admitting: Internal Medicine

## 2012-06-11 ENCOUNTER — Ambulatory Visit (INDEPENDENT_AMBULATORY_CARE_PROVIDER_SITE_OTHER): Payer: Self-pay | Admitting: Internal Medicine

## 2012-06-11 ENCOUNTER — Encounter: Payer: Self-pay | Admitting: Internal Medicine

## 2012-06-11 VITALS — BP 142/91 | HR 77 | Temp 98.3°F | Ht 70.0 in | Wt 262.0 lb

## 2012-06-11 DIAGNOSIS — B20 Human immunodeficiency virus [HIV] disease: Secondary | ICD-10-CM

## 2012-06-11 NOTE — Progress Notes (Signed)
Patient ID: Angela Crane, female   DOB: [birth year], 51 y.o.   MRN: [id number]     [facility] for Infectious Disease  Patient Active Problem List  Diagnosis  . HIV INFECTION  . DISORDER, DEPRESSIVE NEC  . ESSENTIAL HYPERTENSION  . ABSCESS, TOOTH  . COUGH  . Dyslipidemia  . Obesity    Patient's Medications  New Prescriptions   No medications on file  Previous Medications   AMLODIPINE (NORVASC) 10 MG TABLET    Take 1 tablet (10 mg total) by mouth daily.   CITALOPRAM (CELEXA) 20 MG TABLET    Take 20 mg by mouth daily.     EMTRICITABINE-TENOFOVIR (TRUVADA) 200-300 MG PER TABLET    Take 1 tablet by mouth daily.   HYDROCHLOROTHIAZIDE (HYDRODIURIL) 25 MG TABLET    Take 1 tablet (25 mg total) by mouth daily.   LORATADINE (CLARITIN) 10 MG TABLET    Take 10 mg by mouth daily.     NAPROXEN SODIUM (ANAPROX) 220 MG TABLET    Take 220 mg by mouth 2 (two) times daily with a meal.   RALTEGRAVIR (ISENTRESS) 400 MG TABLET    Take 1 tablet (400 mg total) by mouth 2 (two) times daily.  Modified Medications   No medications on file  Discontinued Medications   No medications on file    Subjective: Angela Crane is in for her routine visit. She denies missing any doses of her medications. She has tried to stay busy with part-time work. She still describes a somewhat depressed mood but feels better and chose not to meet with our mental health counselor. She tries to get regular exercise.  Objective: Temp: 98.3 F (36.8 C) (07/02 1010) Temp src: Oral (07/02 1010) BP: 142/91 mmHg (07/02 1010) Pulse Rate: 77  (07/02 1010)  General: She is in good spirits Skin: No rash Lungs: Clear Cor: Regular S1 and S2 and no murmurs  Lab Results HIV 1 RNA Quant (copies/mL)  Date Value  05/13/2012 <20   01/30/2012 <20   10/23/2011 NOT DETECTED      CD4 T Cell Abs (cmm)  Date Value  05/13/2012 1160   01/30/2012 1160   10/23/2011 1500      Assessment: Her HIV infection remains under excellent control. I will  continue her current antiretroviral regimen. I've encouraged her to get regular exercise with a goal of gradual weight loss and improved blood pressure control.  Plan: 1. Continue current medications 2. Lifestyle modification 3. 3 follow up after lab work in 6 months   Cliffton Asters, MD [facility] for Infectious Disease [facility] [phone] pager   [phone] cell 06/11/2012, 10:26 AM

## 2012-09-17 ENCOUNTER — Ambulatory Visit (INDEPENDENT_AMBULATORY_CARE_PROVIDER_SITE_OTHER): Payer: Self-pay

## 2012-09-17 DIAGNOSIS — Z23 Encounter for immunization: Secondary | ICD-10-CM

## 2012-09-25 ENCOUNTER — Telehealth: Payer: Self-pay | Admitting: *Deleted

## 2012-09-25 NOTE — Telephone Encounter (Signed)
Left number to call to make annual PAP smear appt.

## 2012-10-14 ENCOUNTER — Other Ambulatory Visit: Payer: Self-pay | Admitting: Internal Medicine

## 2012-10-23 ENCOUNTER — Other Ambulatory Visit: Payer: Self-pay | Admitting: *Deleted

## 2012-10-23 DIAGNOSIS — B20 Human immunodeficiency virus [HIV] disease: Secondary | ICD-10-CM

## 2012-10-23 MED ORDER — RALTEGRAVIR POTASSIUM 400 MG PO TABS: 400.0000 mg | ORAL_TABLET | Freq: Two times a day (BID) | ORAL | Status: DC

## 2012-10-23 MED ORDER — EMTRICITABINE-TENOFOVIR DF 200-300 MG PO TABS: 1.0000 | ORAL_TABLET | Freq: Every day | ORAL | Status: DC

## 2012-10-24 ENCOUNTER — Encounter: Payer: Self-pay | Admitting: *Deleted

## 2012-11-05 ENCOUNTER — Telehealth: Payer: Self-pay | Admitting: *Deleted

## 2012-11-05 NOTE — Telephone Encounter (Signed)
Unable to leave message.  Left phone number.  Needs to schedule PAP smear appt.

## 2012-12-11 ENCOUNTER — Other Ambulatory Visit: Payer: Self-pay | Admitting: Internal Medicine

## 2012-12-18 ENCOUNTER — Other Ambulatory Visit (INDEPENDENT_AMBULATORY_CARE_PROVIDER_SITE_OTHER): Payer: Self-pay

## 2012-12-18 ENCOUNTER — Other Ambulatory Visit: Payer: Self-pay | Admitting: *Deleted

## 2012-12-18 DIAGNOSIS — Z113 Encounter for screening for infections with a predominantly sexual mode of transmission: Secondary | ICD-10-CM

## 2012-12-18 DIAGNOSIS — Z79899 Other long term (current) drug therapy: Secondary | ICD-10-CM

## 2012-12-18 DIAGNOSIS — B20 Human immunodeficiency virus [HIV] disease: Secondary | ICD-10-CM

## 2012-12-18 DIAGNOSIS — I10 Essential (primary) hypertension: Secondary | ICD-10-CM

## 2012-12-18 LAB — CBC
HCT: 40.2 % (ref 36.0–46.0)
Hemoglobin: 14.1 g/dL (ref 12.0–15.0)
MCH: 28.4 pg (ref 26.0–34.0)
MCV: 81 fL (ref 78.0–100.0)
Platelets: 343 10*3/uL (ref 150–400)
RBC: 4.96 MIL/uL (ref 3.87–5.11)
WBC: 5.8 10*3/uL (ref 4.0–10.5)

## 2012-12-18 LAB — LIPID PANEL
Cholesterol: 151 mg/dL (ref 0–200)
HDL: 37 mg/dL — ABNORMAL LOW (ref >39)
Total CHOL/HDL Ratio: 4.1 Ratio
Triglycerides: 124 mg/dL (ref <150)
VLDL: 25 mg/dL (ref 0–40)

## 2012-12-18 LAB — COMPREHENSIVE METABOLIC PANEL
ALT: 26 U/L (ref 0–35)
BUN: 9 mg/dL (ref 6–23)
CO2: 28 mEq/L (ref 19–32)
Calcium: 9.2 mg/dL (ref 8.4–10.5)
Chloride: 104 mEq/L (ref 96–112)
Creat: 0.83 mg/dL (ref 0.50–1.10)
Glucose, Bld: 122 mg/dL — ABNORMAL HIGH (ref 70–99)
Total Bilirubin: 0.6 mg/dL (ref 0.3–1.2)

## 2012-12-18 MED ORDER — AMLODIPINE BESYLATE 10 MG PO TABS: 10.0000 mg | ORAL_TABLET | Freq: Every day | ORAL | Status: DC

## 2012-12-18 MED ORDER — HYDROCHLOROTHIAZIDE 25 MG PO TABS: 25.0000 mg | ORAL_TABLET | Freq: Every day | ORAL | Status: DC

## 2012-12-19 LAB — T-HELPER CELL (CD4) - (RCID CLINIC ONLY): CD4 T Cell Abs: 1180 uL (ref 400–2700)

## 2013-01-07 ENCOUNTER — Encounter: Payer: Self-pay | Admitting: Internal Medicine

## 2013-01-07 ENCOUNTER — Ambulatory Visit (INDEPENDENT_AMBULATORY_CARE_PROVIDER_SITE_OTHER): Payer: Self-pay | Admitting: Internal Medicine

## 2013-01-07 VITALS — BP 143/93 | HR 96 | Temp 98.2°F | Wt 254.0 lb

## 2013-01-07 DIAGNOSIS — B20 Human immunodeficiency virus [HIV] disease: Secondary | ICD-10-CM

## 2013-01-07 DIAGNOSIS — F329 Major depressive disorder, single episode, unspecified: Secondary | ICD-10-CM

## 2013-01-07 DIAGNOSIS — I10 Essential (primary) hypertension: Secondary | ICD-10-CM

## 2013-01-07 MED ORDER — CITALOPRAM HYDROBROMIDE 20 MG PO TABS: 40.0000 mg | ORAL_TABLET | Freq: Every day | ORAL | Status: DC

## 2013-01-07 NOTE — Progress Notes (Signed)
Patient ID: Angela Crane, female   DOB: 1961/08/03, 52 y.o.   MRN: [id number]     [facility] for Infectious Disease  Patient Active Problem List  Diagnosis  . HIV INFECTION  . DISORDER, DEPRESSIVE NEC  . ESSENTIAL HYPERTENSION  . ABSCESS, TOOTH  . COUGH  . Dyslipidemia  . Obesity    Patient's Medications  New Prescriptions   No medications on file  Previous Medications   AMLODIPINE (NORVASC) 10 MG TABLET    Take 1 tablet (10 mg total) by mouth daily.   EMTRICITABINE-TENOFOVIR (TRUVADA) 200-300 MG PER TABLET    Take 1 tablet by mouth daily.   HYDROCHLOROTHIAZIDE (HYDRODIURIL) 25 MG TABLET    Take 1 tablet (25 mg total) by mouth daily.   LORATADINE (CLARITIN) 10 MG TABLET    Take 10 mg by mouth daily.     NAPROXEN SODIUM (ANAPROX) 220 MG TABLET    Take 220 mg by mouth 2 (two) times daily with a meal.   RALTEGRAVIR (ISENTRESS) 400 MG TABLET    Take 1 tablet (400 mg total) by mouth 2 (two) times daily.  Modified Medications   Modified Medication Previous Medication   CITALOPRAM (CELEXA) 20 MG TABLET citalopram (CELEXA) 20 MG tablet      Take 2 tablets (40 mg total) by mouth daily.    Take 20 mg by mouth daily.    Discontinued Medications   TRUVADA 200-300 MG PER TABLET    TAKE 1 TABLET BY MOUTH DAILY    Subjective: Angela Crane is in for her routine visit. She states that she has been more depressed over the last few months. She's been very sad that she has been unable to see her daughter and her grandchildren since she immigrated here 5 years ago. She speaks to her daughter intermittently. She is afraid to return home to see them because she is not sure that she can return safely here. She states that she can talk to her [employer] case manager, Amy, about this. She states that she is not interested in seeing a mental health counselor. She denies missing any doses of her Truvada or Isentress since her last visit. She says she's not been on her second blood pressure medication (Norvasc) for about  2 years.  Objective: Temp: 98.2 F (36.8 C) (01/28 0845) Temp src: Oral (01/28 0845) BP: 143/93 mmHg (01/28 0845) Pulse Rate: 96  (01/28 0845)  General: Her weight remains elevated at 245 pounds. She is sad and tearful during exam Skin: No rash Lungs: Clear Cor: Regular S1 and S2 no murmurs Abdomen: Obese soft and nontender  Lab Results HIV 1 RNA Quant (copies/mL)  Date Value  12/18/2012 <20   05/13/2012 <20   01/30/2012 <20      CD4 T Cell Abs (cmm)  Date Value  12/18/2012 1180   05/13/2012 1160   01/30/2012 1160      Assessment: Her HIV infection remains under excellent control. I will continue her current regimen.  Her blood pressure is not at goal. I will restart Norvasc along with hydrochlorothiazide.  She is depressed but on willing to see a counselor. She denies any suicidal thoughts. I will increase her Celexa and see her back in 3 months, sooner if she is not improving.  We will see if there is any free legal aid available to her for her to discuss her family's immigration status.  Plan: 1. Continue Truvada and Isentress 2. Restart Norvasc and continue hydrochlorothiazide 3. Increase Celexa to 40  mg daily 4. Research free legal aid 5. Check on status of dental clinic referral 6. Followup in 3 months   Cliffton Asters, MD [facility] for Infectious Disease [facility] [phone] pager   [phone] cell 01/07/2013, 9:10 AM

## 2013-01-20 ENCOUNTER — Ambulatory Visit: Payer: Self-pay

## 2013-03-24 ENCOUNTER — Other Ambulatory Visit: Payer: Self-pay | Admitting: *Deleted

## 2013-03-24 DIAGNOSIS — B20 Human immunodeficiency virus [HIV] disease: Secondary | ICD-10-CM

## 2013-03-27 ENCOUNTER — Other Ambulatory Visit: Payer: Self-pay | Admitting: Internal Medicine

## 2013-03-27 ENCOUNTER — Other Ambulatory Visit (INDEPENDENT_AMBULATORY_CARE_PROVIDER_SITE_OTHER): Payer: Self-pay

## 2013-03-27 DIAGNOSIS — B20 Human immunodeficiency virus [HIV] disease: Secondary | ICD-10-CM

## 2013-03-28 LAB — HIV-1 RNA QUANT-NO REFLEX-BLD
HIV 1 RNA Quant: <20 copies/mL (ref <20)
HIV-1 RNA Quant, Log: <1.3 {Log} (ref <1.30)

## 2013-04-03 ENCOUNTER — Other Ambulatory Visit: Payer: Self-pay | Admitting: Internal Medicine

## 2013-04-03 ENCOUNTER — Other Ambulatory Visit: Payer: Self-pay

## 2013-04-03 DIAGNOSIS — B20 Human immunodeficiency virus [HIV] disease: Secondary | ICD-10-CM

## 2013-04-04 LAB — T-HELPER CELL (CD4) - (RCID CLINIC ONLY): CD4 T Cell Abs: 1310 uL (ref 400–2700)

## 2013-04-08 ENCOUNTER — Ambulatory Visit (INDEPENDENT_AMBULATORY_CARE_PROVIDER_SITE_OTHER): Payer: Self-pay | Admitting: Internal Medicine

## 2013-04-08 ENCOUNTER — Encounter: Payer: Self-pay | Admitting: Internal Medicine

## 2013-04-08 VITALS — BP 128/91 | HR 81 | Temp 97.9°F | Ht 70.0 in | Wt 247.5 lb

## 2013-04-08 DIAGNOSIS — B20 Human immunodeficiency virus [HIV] disease: Secondary | ICD-10-CM

## 2013-04-08 MED ORDER — ELVITEG-COBIC-EMTRICIT-TENOFDF 150-150-200-300 MG PO TABS: 1.0000 | ORAL_TABLET | Freq: Every day | ORAL | Status: DC

## 2013-04-08 NOTE — Progress Notes (Signed)
Patient ID: Angela Crane, female   DOB: [birth year], 52 y.o.   MRN: [id number]          [facility] for Infectious Disease  Patient Active Problem List   Diagnosis Date Noted  . HIV INFECTION 05/20/2007    Priority: High  . Obesity 11/08/2011  . Dyslipidemia 05/11/2011  . ESSENTIAL HYPERTENSION 04/27/2010  . ABSCESS, TOOTH 10/09/2008  . COUGH 04/22/2008  . DISORDER, DEPRESSIVE NEC 05/20/2007    Patient's Medications  New Prescriptions   ELVITEGRAVIR-COBICISTAT-EMTRICITABINE-TENOFOVIR (STRIBILD) 150-150-200-300 MG TABS    Take 1 tablet by mouth daily with breakfast.  Previous Medications   AMLODIPINE (NORVASC) 10 MG TABLET    Take 1 tablet (10 mg total) by mouth daily.   CITALOPRAM (CELEXA) 20 MG TABLET    Take 2 tablets (40 mg total) by mouth daily.   HYDROCHLOROTHIAZIDE (HYDRODIURIL) 25 MG TABLET    Take 1 tablet (25 mg total) by mouth daily.   LORATADINE (CLARITIN) 10 MG TABLET    Take 10 mg by mouth daily.     NAPROXEN SODIUM (ANAPROX) 220 MG TABLET    Take 220 mg by mouth 2 (two) times daily with a meal.  Modified Medications   No medications on file  Discontinued Medications   EMTRICITABINE-TENOFOVIR (TRUVADA) 200-300 MG PER TABLET    Take 1 tablet by mouth daily.   RALTEGRAVIR (ISENTRESS) 400 MG TABLET    Take 1 tablet (400 mg total) by mouth 2 (two) times daily.    Subjective: Angela Crane is in for her routine visit. She has not missed any doses of her Truvada and Isentress. She has been struggling with her depression recently and states that is primarily related to not being able to find work and not being able to see her 34 year old daughter for the past 9 years. Her daughter recently got a visa so that she could travel to the [place] but they cannot afford the air fair. 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 She does have her green card now but has been unable to find a job. She recently told her pastor about her HIV  infection and is actually been counseling him about the stigma associated with this condition.  Objective: Temp: 97.9 F (36.6 C) (04/29 0909) Temp src: Oral (04/29 0909) BP: 128/91 mmHg (04/29 0909) Pulse Rate: 81 (04/29 0909)  General: She is tearful but appropriate. Her body mass index is 35. Skin: No rash Lungs: Clear Cor: Regular S1 and S2 no murmurs Abdomen: Soft and nontender   Lab Results HIV 1 RNA Quant (copies/mL)  Date Value  03/27/2013 <20   12/18/2012 <20   05/13/2012 <20      CD4 T Cell Abs (cmm)  Date Value  04/03/2013 1310   12/18/2012 1180   05/13/2012 1160      Assessment: Her HIV infection remains under excellent control. I will simplify her regimen to once daily Stribild. I d about the potential interaction between Stribild and amlodipine. She will take her blood pressure at home and let me know if she is feeling dizzy, lightheaded or notes that her blood pressure is below what it normally is. I did talk to her about her obesity and elevated glucose. She says that she will start to work on these problems. She does have some mild, chronic depression and says that she talks to her counselor at [facility] on a regular basis. She knows to call if she has increasing  depression between now and her next visit.  Plan: 1. Change antiretroviral regimen to Stribild 1 daily 2. Lifestyle modification 3. Continue counseling for depression 4. Followup after lab work in 6 months   Cliffton Asters, MD [facility] for Infectious Disease [facility] [phone] pager   [phone] cell 04/08/2013, 9:47 AM

## 2013-04-08 NOTE — Progress Notes (Signed)
HPI: Angela Crane is a 52 y.o. female with HIV who is here for her follow up.  Allergies: Allergies  Allergen Reactions  . Sulfonamide Derivatives     REACTION: inflamation    Vitals: Temp: 97.9 F (36.6 C) (04/29 0909) Temp src: Oral (04/29 0909) BP: 128/91 mmHg (04/29 0909) Pulse Rate: 81 (04/29 0909)  Past Medical History: No past medical history on file.  Social History: History   Social History  . Marital Status: Widowed    Spouse Name: N/A    Number of Children: N/A  . Years of Education: N/A   Social History Main Topics  . Smoking status: Never Smoker   . Smokeless tobacco: Never Used  . Alcohol Use: No  . Drug Use: No  . Sexually Active: Not Currently     Comment: given condoms, she gives them out   Other Topics Concern  . None   Social History Narrative  . None     Current Regimen: RAL + Truvada  Labs: HIV 1 RNA Quant (copies/mL)  Date Value  03/27/2013 <20   12/18/2012 <20   05/13/2012 <20      CD4 T Cell Abs (cmm)  Date Value  04/03/2013 1310   12/18/2012 1180   05/13/2012 1160      Hep B S Ab (no units)  Date Value  02/04/2007 YES      Hepatitis B Surface Ag (no units)  Date Value  02/04/2007 NO      HCV Ab (no units)  Date Value  02/04/2007 NO     CrCl: Estimated Creatinine Clearance: 107.6 ml/min (by C-G formula based on Cr of 0.83).  Lipids:    Component Value Date/Time   CHOL 151 12/18/2012 1011   TRIG 124 12/18/2012 1011   HDL 37* 12/18/2012 1011   CHOLHDL 4.1 12/18/2012 1011   VLDL 25 12/18/2012 1011   LDLCALC 89 12/18/2012 1011    Assessment: 52 yo who is doing really well on her current regimen. We decided to simplify her regimen to Stribild for ease of administration. Stribild can interact with amlodipine due to the 3A4 interactions. I missed the pt while she was in clinic so I called her cellphone. She was still confused about the new med so I explained to her what Stribild is. She was very grateful on the phone. I told her that  Stribild could make her Norvasc works a little better and could drop her BP. I advised her to monitor her blood pressure for the next two weeks since she has a BP monitor at home and to call us back if her BP drop too low.   Recommendations: Stribild 1 tab qday Monitor BP, if low consider decrease norvasc to 5mg  qday  Clide Cliff, PharmD Clinical Infectious Disease Pharmacist [facility] for Infectious Disease 04/08/2013, 10:19 AM

## 2013-06-19 NOTE — Addendum Note (Signed)
Addended by: Jennet Maduro D on: 06/19/2013 04:50 PM   Modules accepted: Orders

## 2013-07-04 ENCOUNTER — Telehealth: Payer: Self-pay | Admitting: *Deleted

## 2013-07-04 NOTE — Telephone Encounter (Signed)
RN spoke with pt.  Scheduled PAP smear, lab and MD f/u visits.

## 2013-07-11 ENCOUNTER — Other Ambulatory Visit: Payer: Self-pay | Admitting: Internal Medicine

## 2013-07-11 DIAGNOSIS — F329 Major depressive disorder, single episode, unspecified: Secondary | ICD-10-CM

## 2013-08-18 ENCOUNTER — Other Ambulatory Visit (INDEPENDENT_AMBULATORY_CARE_PROVIDER_SITE_OTHER): Payer: Self-pay

## 2013-08-18 ENCOUNTER — Ambulatory Visit (INDEPENDENT_AMBULATORY_CARE_PROVIDER_SITE_OTHER): Payer: Self-pay | Admitting: *Deleted

## 2013-08-18 DIAGNOSIS — Z124 Encounter for screening for malignant neoplasm of cervix: Secondary | ICD-10-CM

## 2013-08-18 DIAGNOSIS — B20 Human immunodeficiency virus [HIV] disease: Secondary | ICD-10-CM

## 2013-08-18 DIAGNOSIS — Z1231 Encounter for screening mammogram for malignant neoplasm of breast: Secondary | ICD-10-CM

## 2013-08-18 DIAGNOSIS — Z23 Encounter for immunization: Secondary | ICD-10-CM

## 2013-08-18 LAB — COMPREHENSIVE METABOLIC PANEL
AST: 22 U/L (ref 0–37)
Albumin: 4.3 g/dL (ref 3.5–5.2)
Alkaline Phosphatase: 79 U/L (ref 39–117)
BUN: 11 mg/dL (ref 6–23)
Calcium: 9.9 mg/dL (ref 8.4–10.5)
Chloride: 97 mEq/L (ref 96–112)
Potassium: 3.6 mEq/L (ref 3.5–5.3)
Sodium: 140 mEq/L (ref 135–145)
Total Protein: 7.9 g/dL (ref 6.0–8.3)

## 2013-08-18 NOTE — Progress Notes (Signed)
  Subjective:     Angela Crane is a 52 y.o. woman who comes in today for a  pap smear only.  Previous abnormal Pap smears: no. Contraception: menopausal, condoms  Objective:    There were no vitals taken for this visit. Pelvic Exam: Pap smear obtained.   Assessment:    Screening pap smear.   Plan:    Follow up in one year, or as indicated by Pap results.  Pt given educational materials re: HIV and women, self-esteem, nutrition and diet management, PAP smears and partner safety. Pt declined condoms.  Pt completed Mammogram Scholarship application.  Faxed to [facility].

## 2013-08-18 NOTE — Patient Instructions (Signed)
Your results will be ready in about a week.  I will mail them to you.  Thank you for coming to the Center for your care.  Denise,  RN 

## 2013-08-19 LAB — HIV-1 RNA QUANT-NO REFLEX-BLD: HIV 1 RNA Quant: <20 copies/mL (ref <20)

## 2013-08-21 ENCOUNTER — Encounter: Payer: Self-pay | Admitting: *Deleted

## 2013-09-03 ENCOUNTER — Encounter: Payer: Self-pay | Admitting: *Deleted

## 2013-09-03 NOTE — Progress Notes (Signed)
Patient ID: Angela Crane, female   DOB: December 23, 1960, 52 y.o.   MRN: [id number] Requesting appt for this problem.  Appointment scheduled for 09/04/13 @ 8:45 AM w/ Dr. Daiva Eves.

## 2013-09-04 ENCOUNTER — Ambulatory Visit (INDEPENDENT_AMBULATORY_CARE_PROVIDER_SITE_OTHER): Payer: Self-pay | Admitting: Infectious Disease

## 2013-09-04 ENCOUNTER — Encounter: Payer: Self-pay | Admitting: Infectious Disease

## 2013-09-04 VITALS — BP 111/74 | HR 68 | Temp 98.5°F | Wt 253.0 lb

## 2013-09-04 DIAGNOSIS — N952 Postmenopausal atrophic vaginitis: Secondary | ICD-10-CM

## 2013-09-04 DIAGNOSIS — N898 Other specified noninflammatory disorders of vagina: Secondary | ICD-10-CM | POA: Insufficient documentation

## 2013-09-04 DIAGNOSIS — N959 Unspecified menopausal and perimenopausal disorder: Secondary | ICD-10-CM | POA: Insufficient documentation

## 2013-09-04 DIAGNOSIS — Z113 Encounter for screening for infections with a predominantly sexual mode of transmission: Secondary | ICD-10-CM

## 2013-09-04 DIAGNOSIS — I10 Essential (primary) hypertension: Secondary | ICD-10-CM

## 2013-09-04 DIAGNOSIS — B20 Human immunodeficiency virus [HIV] disease: Secondary | ICD-10-CM

## 2013-09-04 DIAGNOSIS — L293 Anogenital pruritus, unspecified: Secondary | ICD-10-CM

## 2013-09-04 MED ORDER — ESTRADIOL 0.025 MG/24HR TD PTTW: 1.0000 | MEDICATED_PATCH | TRANSDERMAL | Status: DC

## 2013-09-04 MED ORDER — FLUCONAZOLE 100 MG PO TABS: 100.0000 mg | ORAL_TABLET | Freq: Every day | ORAL | Status: DC

## 2013-09-04 NOTE — Progress Notes (Signed)
Subjective:    Patient ID: Angela Crane, female    DOB: [birth year], 52 y.o.   MRN: [id number]  Vaginal Itching The patient's pertinent negatives include no vaginal discharge. Pertinent negatives include no abdominal pain, back pain, chills, constipation, diarrhea, dysuria, fever, flank pain, hematuria, nausea, rash, sore throat or vomiting.  Vaginal Discharge The patient's pertinent negatives include no vaginal discharge. Pertinent negatives include no abdominal pain, back pain, chills, constipation, diarrhea, dysuria, fever, flank pain, hematuria, nausea, rash, sore throat or vomiting.    Angela Crane is a 52 y.o. female with HIV infection who is doing superbly well on her  antiviral regimen, STRIBILD with undetectable viral load and health cd4 count.   She comes in the clinic today with an acute complaint of vaginal pruritus. She does not have vaginal discharge. She has noticed dry skin around her vulva and increasing pruritus that has gotten worse with topical emollients.  This seems most likely to be a candidal irritation and infection. She did have yeast on her recent Pap smear earlier this month. I will give her prescription for fluconazole and she can also use over-the-counter clotrimazole or miconazole creams. Also give her an estradiol patch try to help her with her vasomotor symptoms and vaginal dryness.    Review of Systems  Constitutional: Positive for diaphoresis. Negative for fever, chills, activity change, appetite change, fatigue and unexpected weight change.  HENT: Negative for congestion, sore throat, rhinorrhea, sneezing, trouble swallowing and sinus pressure.   Eyes: Negative for photophobia and visual disturbance.  Respiratory: Negative for cough, chest tightness, shortness of breath, wheezing and stridor.   Cardiovascular: Negative for chest pain, palpitations and leg swelling.  Gastrointestinal: Negative for nausea, vomiting, abdominal pain, diarrhea, constipation, blood  in stool, abdominal distention and anal bleeding.  Genitourinary: Negative for dysuria, hematuria, flank pain, vaginal discharge and difficulty urinating.  Musculoskeletal: Negative for myalgias, back pain, joint swelling, arthralgias and gait problem.  Skin: Negative for color change, pallor, rash and wound.  Neurological: Negative for dizziness, tremors, weakness and light-headedness.  Hematological: Negative for adenopathy. Does not bruise/bleed easily.  Psychiatric/Behavioral: Negative for behavioral problems, confusion, sleep disturbance, dysphoric mood, decreased concentration and agitation.       Objective:   Physical Exam  Constitutional: She is oriented to person, place, and time. She appears well-developed and well-nourished. No distress.  HENT:  Head: Normocephalic and atraumatic.  Mouth/Throat: Oropharynx is clear and moist.  Eyes: Conjunctivae and EOM are normal.  Neck: Normal range of motion. Neck supple.  Cardiovascular: Normal rate and regular rhythm.   Pulmonary/Chest: Effort normal and breath sounds normal. No respiratory distress. She has no wheezes. She has no rales. She exhibits no tenderness.  Abdominal: She exhibits no distension. There is no tenderness.  Musculoskeletal: She exhibits no edema.  Neurological: She is alert and oriented to person, place, and time. She exhibits normal muscle tone. Coordination normal.  Skin: Skin is warm and dry. She is not diaphoretic.  Psychiatric: She has a normal mood and affect. Her behavior is normal. Judgment and thought content normal.          Assessment & Plan:  Vaginal pruritus: Likely due to the Candida infection give fluconazole for 14 days and she can try the counter creams. She is only taking 20 mg of Celexa also minimal drug interaction there with lower dose of Celexa.  Vasomotor symptoms and vaginal dryness and patient is perimenopausal: We'll give her estradiol patch  HIV: Perfect control on STRIBILD followup  with Dr. Orvan Falconer next week.  STD screening: Patient has no vaginal discharge and therefore did not perform a pelvic exam or Pap smear. I will check a urine gonorrhea and Chlamydia although she claims not been sexual active for more than 10 years.  Healthcare maintenance ensure that she has had a flu shot.  Hypertension blood pressures were well controlled today.

## 2013-09-05 ENCOUNTER — Ambulatory Visit (HOSPITAL_COMMUNITY)
Admission: RE | Admit: 2013-09-05 | Discharge: 2013-09-05 | Disposition: A | Discharge status: Home / Self Care | Payer: Self-pay | Source: Ambulatory Visit | Attending: Internal Medicine | Admitting: Internal Medicine

## 2013-09-05 DIAGNOSIS — Z1231 Encounter for screening mammogram for malignant neoplasm of breast: Secondary | ICD-10-CM

## 2013-09-30 ENCOUNTER — Ambulatory Visit (INDEPENDENT_AMBULATORY_CARE_PROVIDER_SITE_OTHER): Payer: Self-pay | Admitting: Internal Medicine

## 2013-09-30 ENCOUNTER — Encounter: Payer: Self-pay | Admitting: Internal Medicine

## 2013-09-30 VITALS — BP 142/83 | HR 71 | Temp 97.9°F | Ht 70.0 in | Wt 254.5 lb

## 2013-09-30 DIAGNOSIS — B20 Human immunodeficiency virus [HIV] disease: Secondary | ICD-10-CM

## 2013-09-30 NOTE — Progress Notes (Signed)
Patient ID: Angela Crane, female   DOB: 04-06-1961, 52 y.o.   MRN: [id number]          [facility] for Infectious Disease  Patient Active Problem List   Diagnosis Date Noted  . HIV INFECTION 05/20/2007    Priority: High  . Vaginal pruritus 09/04/2013  . Perimenopausal atrophic vaginitis 09/04/2013  . Menopausal and perimenopausal disorder   . Obesity 11/08/2011  . Dyslipidemia 05/11/2011  . ESSENTIAL HYPERTENSION 04/27/2010  . ABSCESS, TOOTH 10/09/2008  . COUGH 04/22/2008  . DISORDER, DEPRESSIVE NEC 05/20/2007    Patient's Medications  New Prescriptions   No medications on file  Previous Medications   AMLODIPINE (NORVASC) 10 MG TABLET    Take 1 tablet (10 mg total) by mouth daily.   CITALOPRAM (CELEXA) 20 MG TABLET    TAKE 2 TABLETS BY MOUTH EVERY DAY   ELVITEGRAVIR-COBICISTAT-EMTRICITABINE-TENOFOVIR (STRIBILD) 150-150-200-300 MG TABS    Take 1 tablet by mouth daily with breakfast.   ESTRADIOL (VIVELLE-DOT) 0.025 MG/24HR    Place 1 patch onto the skin 2 (two) times a week.   FLUCONAZOLE (DIFLUCAN) 100 MG TABLET    Take 1 tablet (100 mg total) by mouth daily.   HYDROCHLOROTHIAZIDE (HYDRODIURIL) 25 MG TABLET    Take 1 tablet (25 mg total) by mouth daily.   LORATADINE (CLARITIN) 10 MG TABLET    Take 10 mg by mouth daily.     NAPROXEN SODIUM (ANAPROX) 220 MG TABLET    Take 220 mg by mouth 2 (two) times daily with a meal.  Modified Medications   No medications on file  Discontinued Medications   No medications on file    Subjective: Angela Crane is in for her followup visit. She treated recently for probable candidal vaginitis with fluconazole and her symptoms resolved promptly. She has not missed any doses of her Stribild. Her daughter is currently in the [place] from [place] and recovering from severe malaria. She is hoping to bring her grandchildren over soon. She is not feeling as depressed. She is trying to do some walking and would like to lose some weight. Review of  Systems: Pertinent items are noted in HPI.  Past Medical History  Diagnosis Date  . HIV (human immunodeficiency virus infection)   . Hypertension   . Menopausal and perimenopausal disorder     History  Substance Use Topics  . Smoking status: Never Smoker   . Smokeless tobacco: Never Used  . Alcohol Use: No    Family History  Problem Relation Age of Onset  . HIV Daughter   . Diabetes Mother     Allergies  Allergen Reactions  . Sulfonamide Derivatives     REACTION: inflamation    Objective: Temp: 97.9 F (36.6 C) (10/21 1019) Temp src: Oral (10/21 1019) BP: 142/83 mmHg (10/21 1019) Pulse Rate: 71 (10/21 1019)  General: She is in good spirits. Her body mass index remains over 36 Oral: No oropharyngeal lesions Skin: No rash Lungs: Clear Cor: Regular S1-S2 without murmurs Mood and affect: Normal  Lab Results HIV 1 RNA Quant (copies/mL)  Date Value  08/18/2013 <20   03/27/2013 <20   12/18/2012 <20      CD4 T Cell Abs (/uL)  Date Value  08/18/2013 1230   04/03/2013 1310   12/18/2012 1180      Lab Results  Component Value Date   WBC 5.8 12/18/2012   HGB 14.1 12/18/2012   HCT 40.2 12/18/2012   MCV 81.0 12/18/2012   PLT 343 12/18/2012  BMET    Component Value Date/Time   NA 140 08/18/2013 0945   K 3.6 08/18/2013 0945   CL 97 08/18/2013 0945   CO2 31 08/18/2013 0945   GLUCOSE 115* 08/18/2013 0945   BUN 11 08/18/2013 0945   CREATININE 1.00 08/18/2013 0945   CREATININE 0.77 11/02/2010 1420   CALCIUM 9.9 08/18/2013 0945   GFRNONAA >60 04/27/2009 2110   GFRAA >60 04/27/2009 2110   Lab Results  Component Value Date   ALT 17 08/18/2013   AST 22 08/18/2013   ALKPHOS 79 08/18/2013   BILITOT 0.4 08/18/2013   Lab Results  Component Value Date   CHOL 151 12/18/2012   HDL 37* 12/18/2012   LDLCALC 89 12/18/2012   TRIG 124 12/18/2012   CHOLHDL 4.1 12/18/2012   Assessment: Her HIV infection remains under excellent control. I will continue Stribild.  She has morbid obesity and hyperglycemia. I  talked her about the importance of lifestyle occasion. She would like a nutrition referral.  Her blood pressure has improved.  She does not seem as depressed.  Plan: 1. Continue current medications 2. Lifestyle modification 3. Nutrition referral 4. Influenza vaccination provided 5. Follow up after lab work in 3 months   Cliffton Asters, MD [facility] for Infectious Disease [facility] [phone] pager   [phone] cell 09/30/2013, 10:52 AM

## 2013-10-06 ENCOUNTER — Ambulatory Visit (INDEPENDENT_AMBULATORY_CARE_PROVIDER_SITE_OTHER): Payer: Self-pay | Admitting: *Deleted

## 2013-10-06 VITALS — BP 127/82 | HR 92 | Temp 97.8°F | Resp 16 | Ht 68.0 in | Wt 250.5 lb

## 2013-10-06 DIAGNOSIS — B20 Human immunodeficiency virus [HIV] disease: Secondary | ICD-10-CM

## 2013-10-06 DIAGNOSIS — Z21 Asymptomatic human immunodeficiency virus [HIV] infection status: Secondary | ICD-10-CM

## 2013-10-06 LAB — HEPATITIS B SURFACE ANTIGEN: Hepatitis B Surface Ag: NEGATIVE

## 2013-10-06 LAB — COMPREHENSIVE METABOLIC PANEL
Albumin: 4.8 g/dL (ref 3.5–5.2)
Alkaline Phosphatase: 81 U/L (ref 39–117)
BUN: 15 mg/dL (ref 6–23)
Calcium: 9.8 mg/dL (ref 8.4–10.5)
Glucose, Bld: 104 mg/dL — ABNORMAL HIGH (ref 70–99)
Sodium: 140 mEq/L (ref 135–145)
Total Bilirubin: 0.5 mg/dL (ref 0.3–1.2)
Total Protein: 8.7 g/dL — ABNORMAL HIGH (ref 6.0–8.3)

## 2013-10-06 LAB — HEMOGLOBIN A1C: Hgb A1c MFr Bld: 5.8 % — ABNORMAL HIGH (ref <5.7)

## 2013-10-06 LAB — TSH: TSH: 1.543 u[IU]/mL (ref 0.350–4.500)

## 2013-10-06 LAB — HIV ANTIBODY (ROUTINE TESTING W REFLEX): HIV: REACTIVE

## 2013-10-06 LAB — GLUCOSE, RANDOM: Glucose, Bld: 104 mg/dL — ABNORMAL HIGH (ref 70–99)

## 2013-10-06 LAB — URIC ACID: Uric Acid, Serum: 8.1 mg/dL — ABNORMAL HIGH (ref 2.4–7.0)

## 2013-10-06 NOTE — Progress Notes (Signed)
Patient here to screen for ACTG study A5326. Informed consent was obtained after she read the consent and we discussed any questions she had. Angela Crane was first diagnosed in October of 2003 in [place] and was initially treated with Crixivan and Duovir. She had a severe reaction to Bactrim which she was on for prophylaxis and had to be hospitalized. She then came to the [place] and started treatment here in 2005 with Truvada and Kaletra. She developed a case of shingles in 2007, but has not had any major health issues since then. She says she was infected through a transfusion which she got when she became severely ill prior to the HIV diagnosis. At the time they thought she had malaria, but was tested here for it and found to have antibodies to it. If her screening labs are within normal limits and she is deemed eligible for the study, we will set up an opthalmology appt for her prior to entry.

## 2013-10-07 LAB — CORTISOL: Cortisol, Plasma: 11.5 ug/dL

## 2013-10-08 LAB — HIV 1/2 CONFIRMATION
HIV-1 antibody: POSITIVE — AB
HIV-2 Ab: NEGATIVE

## 2013-10-08 LAB — QUANTIFERON TB GOLD ASSAY (BLOOD)
Interferon Gamma Release Assay: NEGATIVE
Mitogen value: 2.23 IU/mL
Quantiferon Nil Value: 0.16 IU/mL
Quantiferon Tb Ag Minus Nil Value: 0 IU/mL

## 2013-10-13 ENCOUNTER — Ambulatory Visit: Payer: Self-pay

## 2013-10-27 ENCOUNTER — Encounter: Payer: Self-pay | Admitting: Internal Medicine

## 2013-10-27 LAB — CD4/CD8 (T-HELPER/T-SUPPRESSOR CELL)
CD4: 1113
CD8 % Suppressor T Cell: 23.9
CD8: 621

## 2013-10-27 LAB — HIV-1 RNA QUANT-NO REFLEX-BLD: HIV-1 RNA Viral Load: 41

## 2013-11-05 ENCOUNTER — Encounter: Payer: Self-pay | Admitting: Internal Medicine

## 2013-11-05 LAB — HIV-1 RNA QUANT-NO REFLEX-BLD: HIV-1 RNA Viral Load: <40

## 2013-11-10 ENCOUNTER — Ambulatory Visit: Payer: Self-pay

## 2013-11-10 NOTE — Progress Notes (Signed)
Medical Nutrition Therapy:  Appt start time: 1030 end time:  1130.  Assessment:  Primary concerns today: HTN, dyslipidemia, obesity. Pt reports she has recently cut down on rice, and reports today with weight loss of 7.5 lbs.   Preferred Learning Style:   Auditory  Visual  Learning Readiness:   Ready  MEDICATIONS: see list.   DIETARY INTAKE: Usual eating pattern includes 1-3 meals and 1-3 snacks per day. Everyday foods include milk, crackers, plantains.  Avoided foods include soda, juice, most sweets.    24-hr recall:  B ( AM): egg, slice of bread (french), lots of coffee (black). Recently she has altered to drink a glass of milk with her coffee and small amount of bread or fruit. Will always have at least a glass of milk.  Snk ( AM): sometimes unsalted crackers  L ( PM): fried plantains with stew beef and some beans. She also enjoys fish and may have just this with plantains and vegetables (spinach and palm oil) Snk ( PM): unsalted crackers D ( PM): drinks a glass of milk and light snack. Mainly just crackers. If she did not have her larger lunch, she would have a similar meal for dinner. Snk ( PM): used to eat snacks until bed, has recently stopped. Beverages: "lots of coffee", a few glasses of wine on occasion (about monthly), no soda, green tea (no sugar), no juice, occasional sweet tea when it is very hot Pt states she has recently cut out some foods or eating occasions she would have when feeding her grandchildren.   Usual physical activity: tries to do some exercise indoors, but this dropped when she was watching her grandchildren. She does some weights now, and wants to make this a routine.   Progress Towards Goal(s):  In progress.   Nutritional Diagnosis:  Obesity as related to sedentary lifestyle, history of large portions and frequent eating occasions as evidenced by BMI>40, dyslipidemia, HTN.    Intervention:  Nutrition counseling provided regarding continued adoption  of portion control, prevention of mindless eating. Best quality foods for heart health were noted, broken down into lean proteins and proteins rich in positive fats (nuts, seeds, fish), best quality fats (avocado, fish, olive oil, canola oil), and fiber-rich carbohydrate foods. Emphasis was also placed on increasing aerobic exercise, which pt seems to prefer to be walking. Pt will walk 15 minutes per day this week and increase by 5 minutes per day each week as tolerated until she does minimum 30 minutes per day walking.   Teaching Method Utilized:  Visual Auditory  Handouts given during visit include:  Best Pro, Fat, and CHO rich foods for heart health  Barriers to learning/adherence to lifestyle change: minimal, some preferences for refined starches native to [employer].   Demonstrated degree of understanding via:  Teach Back   Monitoring/Evaluation:  Dietary intake, exercise, portion control, and body weight prn.

## 2013-11-26 ENCOUNTER — Encounter: Payer: Self-pay | Admitting: Internal Medicine

## 2013-12-30 ENCOUNTER — Other Ambulatory Visit: Payer: Self-pay | Admitting: Internal Medicine

## 2013-12-30 ENCOUNTER — Other Ambulatory Visit (INDEPENDENT_AMBULATORY_CARE_PROVIDER_SITE_OTHER): Payer: Self-pay

## 2013-12-30 DIAGNOSIS — B20 Human immunodeficiency virus [HIV] disease: Secondary | ICD-10-CM

## 2013-12-30 DIAGNOSIS — Z113 Encounter for screening for infections with a predominantly sexual mode of transmission: Secondary | ICD-10-CM

## 2013-12-30 DIAGNOSIS — Z79899 Other long term (current) drug therapy: Secondary | ICD-10-CM

## 2013-12-30 LAB — CBC
HCT: 41.9 % (ref 36.0–46.0)
HEMOGLOBIN: 14.3 g/dL (ref 12.0–15.0)
MCH: 27.6 pg (ref 26.0–34.0)
MCHC: 34.1 g/dL (ref 30.0–36.0)
MCV: 80.7 fL (ref 78.0–100.0)
Platelets: 352 10*3/uL (ref 150–400)
RBC: 5.19 MIL/uL — ABNORMAL HIGH (ref 3.87–5.11)
RDW: 15.9 % — ABNORMAL HIGH (ref 11.5–15.5)
WBC: 6.6 10*3/uL (ref 4.0–10.5)

## 2013-12-30 LAB — COMPLETE METABOLIC PANEL WITH GFR
ALK PHOS: 68 U/L (ref 39–117)
ALT: 15 U/L (ref 0–35)
AST: 21 U/L (ref 0–37)
Albumin: 4.2 g/dL (ref 3.5–5.2)
BILIRUBIN TOTAL: 0.4 mg/dL (ref 0.3–1.2)
BUN: 9 mg/dL (ref 6–23)
CO2: 31 meq/L (ref 19–32)
Calcium: 9.5 mg/dL (ref 8.4–10.5)
Chloride: 100 mEq/L (ref 96–112)
Creat: 0.86 mg/dL (ref 0.50–1.10)
GFR, EST AFRICAN AMERICAN: 89 mL/min
GFR, EST NON AFRICAN AMERICAN: 77 mL/min
GLUCOSE: 95 mg/dL (ref 70–99)
Potassium: 4.2 mEq/L (ref 3.5–5.3)
SODIUM: 139 meq/L (ref 135–145)
TOTAL PROTEIN: 7.7 g/dL (ref 6.0–8.3)

## 2013-12-30 LAB — LIPID PANEL
CHOL/HDL RATIO: 4.3 ratio
CHOLESTEROL: 167 mg/dL (ref 0–200)
HDL: 39 mg/dL — ABNORMAL LOW (ref >39)
LDL Cholesterol: 97 mg/dL (ref 0–99)
Triglycerides: 154 mg/dL — ABNORMAL HIGH (ref <150)
VLDL: 31 mg/dL (ref 0–40)

## 2013-12-30 LAB — RPR

## 2013-12-31 LAB — T-HELPER CELL (CD4) - (RCID CLINIC ONLY)
CD4 % Helper T Cell: 41 % (ref 33–55)
CD4 T Cell Abs: 1290 /uL (ref 400–2700)

## 2013-12-31 LAB — HIV-1 RNA QUANT-NO REFLEX-BLD
HIV 1 RNA Quant: <20 copies/mL (ref <20)
HIV-1 RNA Quant, Log: <1.3 {Log} (ref <1.30)

## 2014-01-20 ENCOUNTER — Ambulatory Visit: Payer: Self-pay | Admitting: Internal Medicine

## 2014-01-31 ENCOUNTER — Other Ambulatory Visit: Payer: Self-pay | Admitting: Internal Medicine

## 2014-02-03 ENCOUNTER — Ambulatory Visit (INDEPENDENT_AMBULATORY_CARE_PROVIDER_SITE_OTHER): Payer: Self-pay | Admitting: Internal Medicine

## 2014-02-03 ENCOUNTER — Encounter: Payer: Self-pay | Admitting: Internal Medicine

## 2014-02-03 VITALS — BP 142/90 | HR 80 | Temp 98.2°F | Ht 70.0 in | Wt 250.8 lb

## 2014-02-03 DIAGNOSIS — B20 Human immunodeficiency virus [HIV] disease: Secondary | ICD-10-CM

## 2014-02-03 NOTE — Progress Notes (Signed)
Patient ID: Angela Crane, female   DOB: 13-May-1961, 53 y.o.   MRN: [id number]          [facility] for Infectious Disease  Patient Active Problem List   Diagnosis Date Noted  . HIV INFECTION 05/20/2007    Priority: High  . Vaginal pruritus 09/04/2013  . Perimenopausal atrophic vaginitis 09/04/2013  . Menopausal and perimenopausal disorder   . Obesity 11/08/2011  . Dyslipidemia 05/11/2011  . ESSENTIAL HYPERTENSION 04/27/2010  . ABSCESS, TOOTH 10/09/2008  . COUGH 04/22/2008  . DISORDER, DEPRESSIVE NEC 05/20/2007    Patient's Medications  New Prescriptions   No medications on file  Previous Medications   AMLODIPINE (NORVASC) 10 MG TABLET    TAKE 1 TABLET BY MOUTH DAILY   CITALOPRAM (CELEXA) 20 MG TABLET    TAKE 2 TABLETS BY MOUTH EVERY DAY   ELVITEGRAVIR-COBICISTAT-EMTRICITABINE-TENOFOVIR (STRIBILD) 150-150-200-300 MG TABS    Take 1 tablet by mouth daily with breakfast.   ESTRADIOL (VIVELLE-DOT) 0.025 MG/24HR    Place 1 patch onto the skin 2 (two) times a week.   FLUCONAZOLE (DIFLUCAN) 100 MG TABLET    Take 1 tablet (100 mg total) by mouth daily.   HYDROCHLOROTHIAZIDE (HYDRODIURIL) 25 MG TABLET    TAKE 1 TABLET BY MOUTH DAILY   LORATADINE (CLARITIN) 10 MG TABLET    Take 10 mg by mouth daily.     NAPROXEN SODIUM (ANAPROX) 220 MG TABLET    Take 220 mg by mouth 2 (two) times daily with a meal.  Modified Medications   No medications on file  Discontinued Medications   No medications on file    Subjective: Angela Crane is in for her routine visit. She has not missed any doses of her medications. She has been working on lifestyle modification after seeing the nutritionist. She does not get any regular exercise. Her daughter has been able to get Social Security number and will be able to apply for work. She is hopeful that her grandchildren will be able to immigrate soon.  Review of Systems: Constitutional: negative Eyes: negative Ears, nose, mouth, throat, and face:  negative Respiratory: negative Cardiovascular: negative Gastrointestinal: negative Genitourinary:negative  Past Medical History  Diagnosis Date  . HIV (human immunodeficiency virus infection)   . Hypertension   . Menopausal and perimenopausal disorder     History  Substance Use Topics  . Smoking status: Never Smoker   . Smokeless tobacco: Never Used  . Alcohol Use: No    Family History  Problem Relation Age of Onset  . HIV Daughter   . Diabetes Mother     Allergies  Allergen Reactions  . Sulfonamide Derivatives     REACTION: inflamation    Objective: Temp: 98.2 F (36.8 C) (02/24 0947) Temp src: Oral (02/24 0947) BP: 142/90 mmHg (02/24 0947) Pulse Rate: 80 (02/24 0947)  Body mass index is 35.98 kg/(m^2).  General:  is in no distress Oral:  no oropharyngeal lesions Skin:  no rash Lungs:  clear Cor: Regular S1-S2 no murmurs Abdomen:  obese, soft and nontender Joints and extremities:  no acute abnormalities Neuro:  alert with normal speech and conversation  Mood and affect: Normal and bright  Lab Results Lab Results  Component Value Date   WBC 6.6 12/30/2013   HGB 14.3 12/30/2013   HCT 41.9 12/30/2013   MCV 80.7 12/30/2013   PLT 352 12/30/2013    Lab Results  Component Value Date   CREATININE 0.86 12/30/2013   BUN 9 12/30/2013   NA 139  12/30/2013   K 4.2 12/30/2013   CL 100 12/30/2013   CO2 31 12/30/2013    Lab Results  Component Value Date   ALT 15 12/30/2013   AST 21 12/30/2013   ALKPHOS 68 12/30/2013   BILITOT 0.4 12/30/2013    Lab Results  Component Value Date   CHOL 167 12/30/2013   HDL 39* 12/30/2013   LDLCALC 97 12/30/2013   TRIG 154* 12/30/2013   CHOLHDL 4.3 12/30/2013    Lab Results HIV 1 RNA Quant (copies/mL)  Date Value  12/30/2013 <20   08/18/2013 <20   03/27/2013 <20      HIV-1 RNA Viral Load (no units)  Date Value  10/06/2013 41   10/06/2013 <40      CD4 T Cell Abs (/uL)  Date Value  12/30/2013 1290   08/18/2013 1230   04/03/2013  1310      Assessment: Her HIV is under excellent control. I will continue Stribild.  Her blood sugar is at goal and her cholesterol panel has improved over the past year.  Her blood pressure remained slightly above goal. She says that she almost had a wreck coming to clinic today and thinks that's why her blood pressure is elevated. She monitors her blood pressure at home and it has been in "normal".  Plan: 1.  continue current medications 2.  Followup after lab work in [place] months   Michel Bickers, MD [facility] [place] [phone] pager   [phone] cell 02/03/2014, 10:07 AM

## 2014-02-27 ENCOUNTER — Other Ambulatory Visit: Payer: Self-pay | Admitting: Internal Medicine

## 2014-02-27 DIAGNOSIS — F329 Major depressive disorder, single episode, unspecified: Secondary | ICD-10-CM

## 2014-02-27 DIAGNOSIS — F32A Depression, unspecified: Secondary | ICD-10-CM

## 2014-03-05 ENCOUNTER — Ambulatory Visit (INDEPENDENT_AMBULATORY_CARE_PROVIDER_SITE_OTHER): Payer: Self-pay | Admitting: Internal Medicine

## 2014-03-05 ENCOUNTER — Encounter: Payer: Self-pay | Admitting: Internal Medicine

## 2014-03-05 VITALS — BP 137/84 | HR 99 | Temp 97.9°F | Wt 248.0 lb

## 2014-03-05 DIAGNOSIS — R059 Cough, unspecified: Secondary | ICD-10-CM

## 2014-03-05 DIAGNOSIS — R05 Cough: Secondary | ICD-10-CM

## 2014-03-05 MED ORDER — GUAIFENESIN-CODEINE 100-10 MG/5ML PO SOLN: 10.0000 mL | Freq: Four times a day (QID) | ORAL | Status: DC | PRN

## 2014-03-05 NOTE — Progress Notes (Signed)
   Subjective:    Patient ID: Angela Crane, female    DOB: 23-Mar-1961, 53 y.o.   MRN: [id number]  HPI 53yo F with hiv, well controlled, CD 4 count 1290(41%)/VL<20 on stribild. Started to have productive cough that is not improved with otc meds, sore throat. No fever, no chills, no nightsweats, no sick contacts that she is aware of.  Current Outpatient Prescriptions on File Prior to Visit  Medication Sig Dispense Refill  . amLODipine (NORVASC) 10 MG tablet TAKE 1 TABLET BY MOUTH DAILY  90 tablet  0  . citalopram (CELEXA) 20 MG tablet TAKE 2 TABLETS BY MOUTH EVERY DAY  60 tablet  5  . elvitegravir-cobicistat-emtricitabine-tenofovir (STRIBILD) 150-150-200-300 MG TABS Take 1 tablet by mouth daily with breakfast.  30 tablet  11  . estradiol (VIVELLE-DOT) 0.025 MG/24HR Place 1 patch onto the skin 2 (two) times a week.  8 patch  12  . fluconazole (DIFLUCAN) 100 MG tablet Take 1 tablet (100 mg total) by mouth daily.  14 tablet  3  . hydrochlorothiazide (HYDRODIURIL) 25 MG tablet TAKE 1 TABLET BY MOUTH DAILY  90 tablet  0  . loratadine (CLARITIN) 10 MG tablet Take 10 mg by mouth daily.        . naproxen sodium (ANAPROX) 220 MG tablet Take 220 mg by mouth 2 (two) times daily with a meal.       No current facility-administered medications on file prior to visit.   Active Ambulatory Problems    Diagnosis Date Noted  . HIV INFECTION 05/20/2007  . DISORDER, DEPRESSIVE NEC 05/20/2007  . ESSENTIAL HYPERTENSION 04/27/2010  . ABSCESS, TOOTH 10/09/2008  . COUGH 04/22/2008  . Dyslipidemia 05/11/2011  . Obesity 11/08/2011  . Vaginal pruritus 09/04/2013  . Perimenopausal atrophic vaginitis 09/04/2013  . Menopausal and perimenopausal disorder    Resolved Ambulatory Problems    Diagnosis Date Noted  . No Resolved Ambulatory Problems   Past Medical History  Diagnosis Date  . HIV (human immunodeficiency virus infection)   . Hypertension     Review of Systems Cough, and sore throat 10 point otherwise  is negative    Objective:   Physical Exam BP 137/84  Pulse 99  Temp(Src) 97.9 F (36.6 C) (Oral)  Wt 248 lb (112.492 kg) Physical Exam  Constitutional:  oriented to person, place, and time. appears well-developed and well-nourished. No distress.  HENT:  Mouth/Throat: Oropharynx is clear and moist. No oropharyngeal exudate.  Cardiovascular: Normal rate, regular rhythm and normal heart sounds. Exam reveals no gallop and no friction rub.  No murmur heard.  Pulmonary/Chest: Effort normal and breath sounds normal. No respiratory distress.  has no wheezes.  Abdominal: Soft. Bowel sounds are normal.  exhibits no distension. There is no tenderness.  Lymphadenopathy: no cervical adenopathy.  Neurological: alert and oriented to person, place, and time.  Skin: Skin is warm and dry. No rash noted. No erythema.  Psychiatric: a normal mood and affect.  behavior is normal.       Assessment & Plan:  Viral uri with cough = will give cough medications and see if she improves. Will wait to see if she may need antibiotics

## 2014-03-30 ENCOUNTER — Other Ambulatory Visit: Payer: Self-pay | Admitting: *Deleted

## 2014-03-30 DIAGNOSIS — B20 Human immunodeficiency virus [HIV] disease: Secondary | ICD-10-CM

## 2014-03-30 MED ORDER — ELVITEG-COBIC-EMTRICIT-TENOFDF 150-150-200-300 MG PO TABS: 1.0000 | ORAL_TABLET | Freq: Every day | ORAL | Status: DC

## 2014-04-03 ENCOUNTER — Other Ambulatory Visit: Payer: Self-pay | Admitting: Internal Medicine

## 2014-04-03 DIAGNOSIS — I10 Essential (primary) hypertension: Secondary | ICD-10-CM

## 2014-04-08 NOTE — Progress Notes (Unsigned)
Patient ID: Angela Crane, female   DOB: [birth year], 53 y.o.   MRN: [id number] [facility] CM services for financial assistance. File expires 04/19/14.

## 2014-05-07 ENCOUNTER — Encounter: Payer: Self-pay | Admitting: Internal Medicine

## 2014-05-13 ENCOUNTER — Telehealth: Payer: Self-pay | Admitting: *Deleted

## 2014-05-13 NOTE — Telephone Encounter (Signed)
Pt has concerns about receiving the correct medication.  Thinks that she received the wrong medication.  Believes that she should be taking Atripla.  Dr. Megan Salon placed the pt on Stribild (03/2013) from Truvada and [place].    [facility] confirms that the pt is on Stribild.    Message for pt to call [facility] if current rx bottle reads Atripla.  If current bottle reads Atripla the pt needs to return it to [facility] to be corrected.  Requested pt call [facility] to let us know what the label reads.

## 2014-08-01 ENCOUNTER — Other Ambulatory Visit: Payer: Self-pay | Admitting: Internal Medicine

## 2014-09-17 ENCOUNTER — Other Ambulatory Visit (INDEPENDENT_AMBULATORY_CARE_PROVIDER_SITE_OTHER): Payer: Self-pay

## 2014-09-17 DIAGNOSIS — B20 Human immunodeficiency virus [HIV] disease: Secondary | ICD-10-CM

## 2014-09-18 LAB — T-HELPER CELL (CD4) - (RCID CLINIC ONLY)
CD4 T CELL ABS: 1760 /uL (ref 400–2700)
CD4 T CELL HELPER: 41 % (ref 33–55)

## 2014-09-18 LAB — HIV-1 RNA QUANT-NO REFLEX-BLD: HIV-1 RNA Quant, Log: <1.3 {Log} (ref <1.30)

## 2014-10-01 ENCOUNTER — Ambulatory Visit: Payer: Self-pay | Admitting: *Deleted

## 2014-10-01 ENCOUNTER — Ambulatory Visit (INDEPENDENT_AMBULATORY_CARE_PROVIDER_SITE_OTHER): Payer: Self-pay | Admitting: Internal Medicine

## 2014-10-01 VITALS — BP 126/79 | HR 75 | Temp 98.3°F | Wt 243.0 lb

## 2014-10-01 VITALS — BP 126/79 | HR 75 | Temp 98.3°F | Resp 16 | Ht 66.93 in | Wt 243.5 lb

## 2014-10-01 DIAGNOSIS — Z21 Asymptomatic human immunodeficiency virus [HIV] infection status: Secondary | ICD-10-CM

## 2014-10-01 DIAGNOSIS — I10 Essential (primary) hypertension: Secondary | ICD-10-CM

## 2014-10-01 DIAGNOSIS — Z23 Encounter for immunization: Secondary | ICD-10-CM

## 2014-10-01 DIAGNOSIS — Z006 Encounter for examination for normal comparison and control in clinical research program: Secondary | ICD-10-CM

## 2014-10-01 LAB — COMPREHENSIVE METABOLIC PANEL
ALK PHOS: 84 U/L (ref 39–117)
ALT: 14 U/L (ref 0–35)
AST: 21 U/L (ref 0–37)
Albumin: 4.3 g/dL (ref 3.5–5.2)
BILIRUBIN TOTAL: 0.5 mg/dL (ref 0.2–1.2)
BUN: 11 mg/dL (ref 6–23)
CO2: 27 mEq/L (ref 19–32)
Calcium: 9.4 mg/dL (ref 8.4–10.5)
Chloride: 103 mEq/L (ref 96–112)
Creat: 0.88 mg/dL (ref 0.50–1.10)
Glucose, Bld: 77 mg/dL (ref 70–99)
Potassium: 4.4 mEq/L (ref 3.5–5.3)
SODIUM: 140 meq/L (ref 135–145)
Total Protein: 7.6 g/dL (ref 6.0–8.3)

## 2014-10-01 LAB — CD4/CD8 (T-HELPER/T-SUPPRESSOR CELL)
CD4%: 47.1
CD4: 1743
CD8 % Suppressor T Cell: 24.2
CD8: 895

## 2014-10-01 LAB — URIC ACID: Uric Acid, Serum: 5.4 mg/dL (ref 2.4–7.0)

## 2014-10-01 LAB — BILIRUBIN,DIRECT & INDIRECT (FRACTIONATED)
BILIRUBIN DIRECT: 0.1 mg/dL (ref 0.0–0.3)
Indirect Bilirubin: 0.4 mg/dL (ref 0.2–1.2)

## 2014-10-01 NOTE — Progress Notes (Signed)
Patient ID: Angela Crane, female   DOB: 08-09-1961, 53 y.o.   MRN: [id number]          Patient Active Problem List   Diagnosis Date Noted  . HIV INFECTION 05/20/2007    Priority: High  . Vaginal pruritus 09/04/2013  . Perimenopausal atrophic vaginitis 09/04/2013  . Menopausal and perimenopausal disorder   . Obesity 11/08/2011  . Dyslipidemia 05/11/2011  . ESSENTIAL HYPERTENSION 04/27/2010  . ABSCESS, TOOTH 10/09/2008  . COUGH 04/22/2008  . DISORDER, DEPRESSIVE NEC 05/20/2007    Patient's Medications  New Prescriptions   No medications on file  Previous Medications   AMLODIPINE (NORVASC) 10 MG TABLET    TAKE 1 TABLET BY MOUTH EVERY DAY   CITALOPRAM (CELEXA) 20 MG TABLET    TAKE 2 TABLETS BY MOUTH EVERY DAY   ELVITEGRAVIR-COBICISTAT-EMTRICITABINE-TENOFOVIR (STRIBILD) 150-150-200-300 MG TABS TABLET    Take 1 tablet by mouth daily with breakfast.   HYDROCHLOROTHIAZIDE (HYDRODIURIL) 25 MG TABLET    TAKE 1 TABLET BY MOUTH EVERY DAY   LORATADINE (CLARITIN) 10 MG TABLET    Take 10 mg by mouth daily.     NAPROXEN SODIUM (ANAPROX) 220 MG TABLET    Take 220 mg by mouth 2 (two) times daily with a meal.  Modified Medications   No medications on file  Discontinued Medications   ESTRADIOL (VIVELLE-DOT) 0.025 MG/24HR    Place 1 patch onto the skin 2 (two) times a week.   FLUCONAZOLE (DIFLUCAN) 100 MG TABLET    Take 1 tablet (100 mg total) by mouth daily.   GUAIFENESIN-CODEINE 100-10 MG/5ML SYRUP    Take 10 mLs by mouth every 6 (six) hours as needed for cough.    Subjective: Angela Crane is in for her routine visit. She is feeling well and without complaints. She has not missed a single dose of her Stribild or blood pressure medications. She varies the time of day that she takes her medications and will occasionally take her Stribild without food. Review of Systems: Pertinent items are noted in HPI.  Past Medical History  Diagnosis Date  . HIV (human immunodeficiency virus infection)   .  Hypertension   . Menopausal and perimenopausal disorder     History  Substance Use Topics  . Smoking status: Never Smoker   . Smokeless tobacco: Never Used  . Alcohol Use: No    Family History  Problem Relation Age of Onset  . HIV Daughter   . Diabetes Mother     Allergies  Allergen Reactions  . Sulfonamide Derivatives     REACTION: inflamation    Objective:   There is no weight on file to calculate BMI.  General: She is in very good spirits. She continues to lose weight gradually but her BMI is still 35 Oral: No oropharyngeal lesions Skin: No rash Lungs: Clear Cor: Regular S1 and S2 with no murmur  Lab Results Lab Results  Component Value Date   WBC 6.6 12/30/2013   HGB 14.3 12/30/2013   HCT 41.9 12/30/2013   MCV 80.7 12/30/2013   PLT 352 12/30/2013    Lab Results  Component Value Date   CREATININE 0.86 12/30/2013   BUN 9 12/30/2013   NA 139 12/30/2013   K 4.2 12/30/2013   CL 100 12/30/2013   CO2 31 12/30/2013    Lab Results  Component Value Date   ALT 15 12/30/2013   AST 21 12/30/2013   ALKPHOS 68 12/30/2013   BILITOT 0.4 12/30/2013    Lab Results  Component Value Date   CHOL 167 12/30/2013   HDL 39* 12/30/2013   LDLCALC 97 12/30/2013   TRIG 154* 12/30/2013   CHOLHDL 4.3 12/30/2013    Lab Results HIV 1 RNA Quant (copies/mL)  Date Value  09/17/2014 <20   12/30/2013 <20   08/18/2013 <20      HIV-1 RNA Viral Load (no units)  Date Value  10/06/2013 41   10/06/2013 <40      CD4 (no units)  Date Value  10/06/2013 1113      CD4 T Cell Abs (/uL)  Date Value  09/17/2014 1760   12/30/2013 1290   08/18/2013 1230      Assessment: Her infection and blood pressure are under very good control.  Plan: 1. Continue Stribild 2. Continue antihypertensive regimen 3. Continue gradual and sustainable weight loss 4. Followup in 6 months   Michel Bickers, MD [facility] [place] [phone] pager   [phone]  cell 10/01/2014, 4:00 PM

## 2014-10-02 LAB — HEMOGLOBIN A1C
Hgb A1c MFr Bld: 5.8 % — ABNORMAL HIGH (ref <5.7)
MEAN PLASMA GLUCOSE: 120 mg/dL — AB (ref <117)

## 2014-10-02 NOTE — Progress Notes (Signed)
Angela Crane is here today to screen for ACTG [phone] Study which is Evaluating the effect of Sitagliptin on Inflammation markers in HIV. Informed consent was obtained after she had time to read the consent and ask questions. She understands she will be taking a pill which may be placebo for a period of 16 weeks. She was able to verbalize what the study is proposing and what is expected of her during the study. She understands it is completely voluntary and can withdraw at any time without penalty. She denies any current problems. We reviewed her medication list and she said she is not taking the Estradiol patch and only takes loratadine in the springtime. She was originally diagnosed in [place] and started on a regimen of Duovir and Crixivan in 2004. She has not had a period in over 2 years and is considered menopausal. She will return next Thursday for the preentry visit.

## 2014-10-02 NOTE — Progress Notes (Signed)
   Subjective:    Patient ID: Angela Crane, female    DOB: [birth year], 53 y.o.   MRN: [id number]  HPI    Review of Systems  Constitutional: Negative.   HENT: Negative.   Eyes: Negative.   Respiratory: Negative.   Cardiovascular: Negative.   Gastrointestinal: Negative.   Genitourinary: Negative.   Musculoskeletal: Negative.   Skin: Negative.   Neurological: Negative.   Psychiatric/Behavioral: Negative.        Objective:   Physical Exam  Constitutional: She is oriented to person, place, and time.  HENT:  Mouth/Throat: Oropharynx is clear and moist.  Eyes: No scleral icterus.  Neck: Normal range of motion.  Cardiovascular: Normal rate, regular rhythm, normal heart sounds and intact distal pulses.   Pulmonary/Chest: Effort normal and breath sounds normal.  Abdominal: Soft. Bowel sounds are normal.  Musculoskeletal: Normal range of motion. She exhibits no edema.  Lymphadenopathy:    She has no cervical adenopathy.  Neurological: She is alert and oriented to person, place, and time.  Skin: Skin is warm and dry.  Psychiatric: She has a normal mood and affect.          Assessment & Plan:

## 2014-10-08 ENCOUNTER — Ambulatory Visit (INDEPENDENT_AMBULATORY_CARE_PROVIDER_SITE_OTHER): Payer: Self-pay | Admitting: *Deleted

## 2014-10-08 DIAGNOSIS — B20 Human immunodeficiency virus [HIV] disease: Secondary | ICD-10-CM

## 2014-10-08 DIAGNOSIS — Z006 Encounter for examination for normal comparison and control in clinical research program: Secondary | ICD-10-CM

## 2014-10-08 NOTE — Progress Notes (Signed)
Angela Crane is here for the preentry visit for [phone]. She plans to enroll next Wednesday. She is eligible based on her labs.

## 2014-10-09 ENCOUNTER — Encounter: Payer: Self-pay | Admitting: Internal Medicine

## 2014-10-15 ENCOUNTER — Ambulatory Visit (INDEPENDENT_AMBULATORY_CARE_PROVIDER_SITE_OTHER): Payer: Self-pay | Admitting: *Deleted

## 2014-10-15 VITALS — BP 124/78 | HR 80 | Temp 98.0°F | Resp 16 | Wt 243.5 lb

## 2014-10-15 DIAGNOSIS — Z006 Encounter for examination for normal comparison and control in clinical research program: Secondary | ICD-10-CM

## 2014-10-15 DIAGNOSIS — B20 Human immunodeficiency virus [HIV] disease: Secondary | ICD-10-CM

## 2014-10-15 LAB — POCT URINALYSIS DIPSTICK
Bilirubin, UA: NEGATIVE
Blood, UA: NEGATIVE
Glucose, UA: NEGATIVE
KETONES UA: NEGATIVE
Leukocytes, UA: NEGATIVE
Nitrite, UA: NEGATIVE
Spec Grav, UA: 1.015
Urobilinogen, UA: 0.2
pH, UA: 8

## 2014-10-15 MED ORDER — SITAGLIPTIN PHOSPHATE 100 MG PO TABS: 100.0000 mg | ORAL_TABLET | Freq: Every day | ORAL | Status: DC

## 2014-10-15 NOTE — Progress Notes (Signed)
Angela Crane was randomized to the A5346 study today. She will receive sitagliptin 100 mg or placebo to take daily. She said she will start today. We reviewed what side effects to call for, specifically rash, abd. pain and anything unusual or severe. She will return in 4 weeks for the next study appt. She denies any problems or concerns today.

## 2014-10-16 LAB — COMPREHENSIVE METABOLIC PANEL
ALK PHOS: 97 U/L (ref 39–117)
ALT: 17 U/L (ref 0–35)
AST: 27 U/L (ref 0–37)
Albumin: 4.3 g/dL (ref 3.5–5.2)
BILIRUBIN TOTAL: 0.6 mg/dL (ref 0.2–1.2)
BUN: 11 mg/dL (ref 6–23)
CALCIUM: 9.7 mg/dL (ref 8.4–10.5)
CO2: 30 mEq/L (ref 19–32)
CREATININE: 1.11 mg/dL — AB (ref 0.50–1.10)
Chloride: 96 mEq/L (ref 96–112)
Glucose, Bld: 98 mg/dL (ref 70–99)
Potassium: 3.6 mEq/L (ref 3.5–5.3)
Sodium: 139 mEq/L (ref 135–145)
Total Protein: 8.3 g/dL (ref 6.0–8.3)

## 2014-10-16 LAB — URIC ACID: Uric Acid, Serum: 6.8 mg/dL (ref 2.4–7.0)

## 2014-10-16 LAB — BILIRUBIN,DIRECT & INDIRECT (FRACTIONATED)
BILIRUBIN DIRECT: 0.1 mg/dL (ref 0.0–0.3)
Indirect Bilirubin: 0.5 mg/dL (ref 0.2–1.2)

## 2014-11-12 ENCOUNTER — Ambulatory Visit (INDEPENDENT_AMBULATORY_CARE_PROVIDER_SITE_OTHER): Payer: Self-pay | Admitting: *Deleted

## 2014-11-12 VITALS — BP 127/84 | HR 84 | Temp 97.9°F | Resp 16 | Wt 252.5 lb

## 2014-11-12 DIAGNOSIS — Z006 Encounter for examination for normal comparison and control in clinical research program: Secondary | ICD-10-CM

## 2014-11-12 DIAGNOSIS — B20 Human immunodeficiency virus [HIV] disease: Secondary | ICD-10-CM

## 2014-11-12 NOTE — Progress Notes (Signed)
Angela Crane was here for her week 4 A5346 study visit. She says she has been very adherent with her study meds and returned 5 pills today. She said the only problem she noticed with them was aching joints about a week after she started them and it went away after less than a week. She has not changed any of her other meds. She will return in 4 weeks for the next study visit on Jan. 4th.

## 2014-11-18 ENCOUNTER — Other Ambulatory Visit: Payer: Self-pay | Admitting: Internal Medicine

## 2014-11-18 DIAGNOSIS — F32A Depression, unspecified: Secondary | ICD-10-CM

## 2014-11-18 DIAGNOSIS — F329 Major depressive disorder, single episode, unspecified: Secondary | ICD-10-CM

## 2014-11-20 ENCOUNTER — Encounter: Payer: Self-pay | Admitting: Internal Medicine

## 2014-11-20 LAB — CD4/CD8 (T-HELPER/T-SUPPRESSOR CELL)
CD4%: 45.1
CD4: 1263
CD8 % Suppressor T Cell: 20.7
CD8: 580

## 2014-12-14 ENCOUNTER — Ambulatory Visit (INDEPENDENT_AMBULATORY_CARE_PROVIDER_SITE_OTHER): Payer: Self-pay | Admitting: *Deleted

## 2014-12-14 VITALS — BP 114/77 | HR 82 | Temp 98.2°F | Resp 18 | Wt 249.2 lb

## 2014-12-14 DIAGNOSIS — Z006 Encounter for examination for normal comparison and control in clinical research program: Secondary | ICD-10-CM

## 2014-12-14 DIAGNOSIS — B20 Human immunodeficiency virus [HIV] disease: Secondary | ICD-10-CM

## 2014-12-14 LAB — COMPREHENSIVE METABOLIC PANEL
ALT: 15 U/L (ref 0–35)
AST: 20 U/L (ref 0–37)
Albumin: 3.9 g/dL (ref 3.5–5.2)
Alkaline Phosphatase: 89 U/L (ref 39–117)
BILIRUBIN TOTAL: 0.5 mg/dL (ref 0.2–1.2)
BUN: 12 mg/dL (ref 6–23)
CALCIUM: 9.4 mg/dL (ref 8.4–10.5)
CHLORIDE: 103 meq/L (ref 96–112)
CO2: 27 meq/L (ref 19–32)
CREATININE: 0.88 mg/dL (ref 0.50–1.10)
GLUCOSE: 91 mg/dL (ref 70–99)
Potassium: 4.2 mEq/L (ref 3.5–5.3)
Sodium: 141 mEq/L (ref 135–145)
TOTAL PROTEIN: 7.5 g/dL (ref 6.0–8.3)

## 2014-12-14 LAB — CBC WITH DIFFERENTIAL/PLATELET
BASOS ABS: 0 10*3/uL (ref 0.0–0.1)
Basophils Relative: 0 % (ref 0–1)
EOS ABS: 0.1 10*3/uL (ref 0.0–0.7)
EOS PCT: 1 % (ref 0–5)
HCT: 41.6 % (ref 36.0–46.0)
HEMOGLOBIN: 14.3 g/dL (ref 12.0–15.0)
LYMPHS ABS: 3.2 10*3/uL (ref 0.7–4.0)
LYMPHS PCT: 49 % — AB (ref 12–46)
MCH: 28.1 pg (ref 26.0–34.0)
MCHC: 34.4 g/dL (ref 30.0–36.0)
MCV: 81.7 fL (ref 78.0–100.0)
MPV: 10.5 fL (ref 8.6–12.4)
Monocytes Absolute: 0.3 10*3/uL (ref 0.1–1.0)
Monocytes Relative: 5 % (ref 3–12)
Neutro Abs: 2.9 10*3/uL (ref 1.7–7.7)
Neutrophils Relative %: 45 % (ref 43–77)
Platelets: 285 10*3/uL (ref 150–400)
RBC: 5.09 MIL/uL (ref 3.87–5.11)
RDW: 15.4 % (ref 11.5–15.5)
WBC: 6.5 10*3/uL (ref 4.0–10.5)

## 2014-12-14 LAB — BILIRUBIN,DIRECT & INDIRECT (FRACTIONATED)
Bilirubin, Direct: 0.1 mg/dL (ref 0.0–0.3)
Indirect Bilirubin: 0.4 mg/dL (ref 0.2–1.2)

## 2014-12-14 LAB — URIC ACID: Uric Acid, Serum: 5.9 mg/dL (ref 2.4–7.0)

## 2014-12-14 NOTE — Progress Notes (Signed)
Angela Crane is here for her week 8 A5346 study visit. She denies any side effects from the medicine, but has noticed that the bottom of her left heel has been sore the past few weeks. Adherence is excellent, she has not missed any doses ofstudy med or her stribild. She returns in 7 weeks for the next study visit.

## 2015-01-28 ENCOUNTER — Ambulatory Visit (INDEPENDENT_AMBULATORY_CARE_PROVIDER_SITE_OTHER): Payer: Self-pay | Admitting: *Deleted

## 2015-01-28 VITALS — BP 124/80 | HR 77 | Temp 98.3°F | Wt 251.5 lb

## 2015-01-28 DIAGNOSIS — Z006 Encounter for examination for normal comparison and control in clinical research program: Secondary | ICD-10-CM

## 2015-01-28 LAB — POCT URINALYSIS DIPSTICK
BILIRUBIN UA: NEGATIVE
GLUCOSE UA: NEGATIVE
Ketones, UA: NEGATIVE
LEUKOCYTES UA: NEGATIVE
NITRITE UA: NEGATIVE
Protein, UA: NEGATIVE
Spec Grav, UA: >=1.03
UROBILINOGEN UA: 0.2
pH, UA: 5

## 2015-01-28 LAB — BILIRUBIN,DIRECT & INDIRECT (FRACTIONATED)
Bilirubin, Direct: 0.1 mg/dL (ref 0.0–0.3)
Indirect Bilirubin: 0.3 mg/dL (ref 0.2–1.2)

## 2015-01-28 LAB — COMPREHENSIVE METABOLIC PANEL
ALBUMIN: 4.1 g/dL (ref 3.5–5.2)
ALK PHOS: 92 U/L (ref 39–117)
ALT: 18 U/L (ref 0–35)
AST: 19 U/L (ref 0–37)
BUN: 8 mg/dL (ref 6–23)
CALCIUM: 9.6 mg/dL (ref 8.4–10.5)
CHLORIDE: 105 meq/L (ref 96–112)
CO2: 22 mEq/L (ref 19–32)
Creat: 0.8 mg/dL (ref 0.50–1.10)
Glucose, Bld: 92 mg/dL (ref 70–99)
POTASSIUM: 4.4 meq/L (ref 3.5–5.3)
SODIUM: 139 meq/L (ref 135–145)
TOTAL PROTEIN: 7.9 g/dL (ref 6.0–8.3)
Total Bilirubin: 0.4 mg/dL (ref 0.2–1.2)

## 2015-01-28 LAB — CD4/CD8 (T-HELPER/T-SUPPRESSOR CELL)
CD4%: 48.3
CD4: 1546
CD8 % Suppressor T Cell: 21.3
CD8: 682

## 2015-01-28 LAB — URIC ACID: Uric Acid, Serum: 4.5 mg/dL (ref 2.4–7.0)

## 2015-01-28 NOTE — Progress Notes (Signed)
Angela Crane is here for week 15 of study A5346. She denies any new problems or concerns. She has had no problems taking the study med or her ARVS. She will return next week for the next study appt.

## 2015-01-29 LAB — HEMOGLOBIN A1C
HEMOGLOBIN A1C: 5.8 % — AB (ref <5.7)
MEAN PLASMA GLUCOSE: 120 mg/dL — AB (ref <117)

## 2015-02-03 ENCOUNTER — Ambulatory Visit (INDEPENDENT_AMBULATORY_CARE_PROVIDER_SITE_OTHER): Payer: Self-pay | Admitting: *Deleted

## 2015-02-03 VITALS — BP 125/80 | HR 86 | Temp 98.4°F | Resp 16 | Wt 252.0 lb

## 2015-02-03 DIAGNOSIS — Z006 Encounter for examination for normal comparison and control in clinical research program: Secondary | ICD-10-CM

## 2015-02-03 NOTE — Progress Notes (Signed)
Angela Crane is here for A5346, week 16 study as well as entry into A5128. We reviewed [phone] informed consent together. I fully explained the consent, risks, benefits, responsibilities, and answered questions. Participant verbalized understanding and signed the consent witnessed by me. I gave a copy of the signed consent to participant. Vital signs are stable and fasting blood was drawn with no problem. Did not obtain stored serum due to misplacement of tube. Last dose of sitagliptin/placebo was this morning on 02/03/2015. Pill count was performed and 19 pills were left. She received $60 gift card for study visit. Final visit for A5346 is scheduled for Thursday, March 04, 2015. At this visit we will also screen for A5332 and [phone]. Eliezer Champagne RN

## 2015-02-03 NOTE — Addendum Note (Signed)
Addended by: Araceli Bouche on: 02/03/2015 10:41 AM   Modules accepted: Orders, Medications

## 2015-02-10 ENCOUNTER — Encounter: Payer: Self-pay | Admitting: Internal Medicine

## 2015-02-10 LAB — HIV-1 RNA QUANT-NO REFLEX-BLD

## 2015-02-22 ENCOUNTER — Other Ambulatory Visit: Payer: Self-pay | Admitting: *Deleted

## 2015-02-22 DIAGNOSIS — Z113 Encounter for screening for infections with a predominantly sexual mode of transmission: Secondary | ICD-10-CM

## 2015-03-04 ENCOUNTER — Ambulatory Visit (INDEPENDENT_AMBULATORY_CARE_PROVIDER_SITE_OTHER): Payer: Self-pay | Admitting: *Deleted

## 2015-03-04 VITALS — BP 124/77 | HR 80 | Temp 97.9°F | Resp 16 | Ht 67.5 in | Wt 253.0 lb

## 2015-03-04 DIAGNOSIS — Z006 Encounter for examination for normal comparison and control in clinical research program: Secondary | ICD-10-CM

## 2015-03-04 LAB — COMPREHENSIVE METABOLIC PANEL
ALT: 17 U/L (ref 0–35)
AST: 21 U/L (ref 0–37)
Albumin: 4.3 g/dL (ref 3.5–5.2)
Alkaline Phosphatase: 99 U/L (ref 39–117)
BILIRUBIN TOTAL: 0.4 mg/dL (ref 0.2–1.2)
BUN: 10 mg/dL (ref 6–23)
CHLORIDE: 97 meq/L (ref 96–112)
CO2: 28 mEq/L (ref 19–32)
CREATININE: 0.83 mg/dL (ref 0.50–1.10)
Calcium: 9.7 mg/dL (ref 8.4–10.5)
Glucose, Bld: 104 mg/dL — ABNORMAL HIGH (ref 70–99)
Potassium: 3.6 mEq/L (ref 3.5–5.3)
Sodium: 137 mEq/L (ref 135–145)
Total Protein: 7.8 g/dL (ref 6.0–8.3)

## 2015-03-04 LAB — LIPID PANEL
Cholesterol: 197 mg/dL (ref 0–200)
HDL: 43 mg/dL — AB (ref >=46)
LDL Cholesterol: 123 mg/dL — ABNORMAL HIGH (ref 0–99)
TRIGLYCERIDES: 156 mg/dL — AB (ref <150)
Total CHOL/HDL Ratio: 4.6 Ratio
VLDL: 31 mg/dL (ref 0–40)

## 2015-03-04 LAB — CBC WITH DIFFERENTIAL/PLATELET
BASOS ABS: 0.1 10*3/uL (ref 0.0–0.1)
Basophils Relative: 1 % (ref 0–1)
Eosinophils Absolute: 0.1 10*3/uL (ref 0.0–0.7)
Eosinophils Relative: 2 % (ref 0–5)
HEMATOCRIT: 40.2 % (ref 36.0–46.0)
HEMOGLOBIN: 13.9 g/dL (ref 12.0–15.0)
LYMPHS ABS: 2.8 10*3/uL (ref 0.7–4.0)
LYMPHS PCT: 43 % (ref 12–46)
MCH: 28.1 pg (ref 26.0–34.0)
MCHC: 34.6 g/dL (ref 30.0–36.0)
MCV: 81.2 fL (ref 78.0–100.0)
MPV: 10 fL (ref 8.6–12.4)
Monocytes Absolute: 0.3 10*3/uL (ref 0.1–1.0)
Monocytes Relative: 5 % (ref 3–12)
NEUTROS PCT: 49 % (ref 43–77)
Neutro Abs: 3.1 10*3/uL (ref 1.7–7.7)
Platelets: 330 10*3/uL (ref 150–400)
RBC: 4.95 MIL/uL (ref 3.87–5.11)
RDW: 15.6 % — ABNORMAL HIGH (ref 11.5–15.5)
WBC: 6.4 10*3/uL (ref 4.0–10.5)

## 2015-03-04 NOTE — Progress Notes (Signed)
Angela Crane is here for her final week 20 study visit and to screen for the Reprieve study. Informed consent was obtained after she took the consents home for the main study and substudy and had time to read over them. We discussed the details of the study and what is expected of her during the study. She verbalized understanding and is willing to do both studies. She currently denies any complaints. We have tentatively planned the entry day for 4/15 and hope to do the scan on the same dy.

## 2015-03-04 NOTE — Progress Notes (Signed)
   Subjective:    Patient ID: Angela Crane, female    DOB: 10-25-1961, 54 y.o.   MRN: [id number]  HPI    Review of Systems  Constitutional: Negative.   HENT: Negative.   Eyes: Negative.   Respiratory: Negative.   Cardiovascular: Negative.   Gastrointestinal: Negative.   Genitourinary: Negative.   Musculoskeletal: Negative.   Skin: Negative.   Neurological: Negative.   Psychiatric/Behavioral: Negative.        Objective:   Physical Exam  Constitutional: She is oriented to person, place, and time. She appears well-developed and well-nourished.  HENT:  Mouth/Throat: Oropharynx is clear and moist.  Eyes: No scleral icterus.  Cardiovascular: Normal rate, regular rhythm, normal heart sounds and intact distal pulses.   Pulmonary/Chest: Effort normal and breath sounds normal.  Abdominal: Soft. Bowel sounds are normal.  Musculoskeletal: Normal range of motion. She exhibits no edema.  Lymphadenopathy:    She has no cervical adenopathy.  Neurological: She is alert and oriented to person, place, and time.  Skin: Skin is warm and dry.  Psychiatric: She has a normal mood and affect.          Assessment & Plan:

## 2015-03-08 ENCOUNTER — Other Ambulatory Visit: Payer: Self-pay | Admitting: *Deleted

## 2015-03-08 DIAGNOSIS — Z006 Encounter for examination for normal comparison and control in clinical research program: Secondary | ICD-10-CM

## 2015-03-17 ENCOUNTER — Other Ambulatory Visit: Payer: Self-pay | Admitting: Internal Medicine

## 2015-03-23 ENCOUNTER — Ambulatory Visit (INDEPENDENT_AMBULATORY_CARE_PROVIDER_SITE_OTHER): Payer: Self-pay | Admitting: *Deleted

## 2015-03-23 DIAGNOSIS — Z006 Encounter for examination for normal comparison and control in clinical research program: Secondary | ICD-10-CM

## 2015-03-23 LAB — COMPREHENSIVE METABOLIC PANEL
ALBUMIN: 4 g/dL (ref 3.5–5.2)
ALK PHOS: 90 U/L (ref 39–117)
ALT: 12 U/L (ref 0–35)
AST: 18 U/L (ref 0–37)
BUN: 7 mg/dL (ref 6–23)
CO2: 23 mEq/L (ref 19–32)
Calcium: 9 mg/dL (ref 8.4–10.5)
Chloride: 102 mEq/L (ref 96–112)
Creat: 0.7 mg/dL (ref 0.50–1.10)
GLUCOSE: 80 mg/dL (ref 70–99)
POTASSIUM: 4.2 meq/L (ref 3.5–5.3)
SODIUM: 136 meq/L (ref 135–145)
TOTAL PROTEIN: 7.6 g/dL (ref 6.0–8.3)
Total Bilirubin: 0.4 mg/dL (ref 0.2–1.2)

## 2015-03-23 NOTE — Progress Notes (Signed)
Angela Crane is here for CMET only prior to CT scan on Friday.No new problems verbalized.

## 2015-03-26 ENCOUNTER — Ambulatory Visit (HOSPITAL_COMMUNITY): Payer: Self-pay

## 2015-03-26 ENCOUNTER — Ambulatory Visit (INDEPENDENT_AMBULATORY_CARE_PROVIDER_SITE_OTHER): Payer: Self-pay | Admitting: *Deleted

## 2015-03-26 VITALS — BP 126/82 | HR 67 | Temp 98.2°F | Resp 16 | Ht 67.5 in | Wt 255.0 lb

## 2015-03-26 DIAGNOSIS — Z006 Encounter for examination for normal comparison and control in clinical research program: Secondary | ICD-10-CM

## 2015-03-26 MED ORDER — PITAVASTATIN CALCIUM 4 MG PO TABS: 4.0000 mg | ORAL_TABLET | Freq: Every day | ORAL | Status: DC

## 2015-03-26 NOTE — Progress Notes (Signed)
Angela Crane was randomized to the Reprieve study and substudy today. She will receive pitavastatin 4mg  or placebo to take daily. A cardiac CT scan will be done within the next 2 weeks and she will start meds after that. She was instructed on dosing, what side effects to watch for and to call us for problems. She denies any current problems right now. She will return 1 month from today for followup. She was instructed to bring her pill bottle back for pill counts at each visit and to let us know if she starts any new medication.

## 2015-03-30 LAB — HIV-1 RNA QUANT-NO REFLEX-BLD: HIV-1 RNA Quant, Log: <1.3 {Log} (ref <1.30)

## 2015-04-05 ENCOUNTER — Ambulatory Visit (INDEPENDENT_AMBULATORY_CARE_PROVIDER_SITE_OTHER): Payer: Self-pay | Admitting: *Deleted

## 2015-04-05 DIAGNOSIS — Z006 Encounter for examination for normal comparison and control in clinical research program: Secondary | ICD-10-CM

## 2015-04-05 LAB — COMPREHENSIVE METABOLIC PANEL
ALK PHOS: 104 U/L (ref 39–117)
ALT: 17 U/L (ref 0–35)
AST: 23 U/L (ref 0–37)
Albumin: 4.2 g/dL (ref 3.5–5.2)
BUN: 6 mg/dL (ref 6–23)
CHLORIDE: 102 meq/L (ref 96–112)
CO2: 25 meq/L (ref 19–32)
Calcium: 9.3 mg/dL (ref 8.4–10.5)
Creat: 0.78 mg/dL (ref 0.50–1.10)
GLUCOSE: 99 mg/dL (ref 70–99)
Potassium: 3.7 mEq/L (ref 3.5–5.3)
SODIUM: 140 meq/L (ref 135–145)
Total Bilirubin: 0.3 mg/dL (ref 0.2–1.2)
Total Protein: 7.8 g/dL (ref 6.0–8.3)

## 2015-04-05 NOTE — Progress Notes (Signed)
Here for lab draw only. CT planned for this Thursday.

## 2015-04-07 ENCOUNTER — Encounter: Payer: Self-pay | Admitting: Internal Medicine

## 2015-04-07 LAB — CD4/CD8 (T-HELPER/T-SUPPRESSOR CELL)
CD4%: 49.5
CD4: 1485
CD8 % Suppressor T Cell: 22.7
CD8: 681

## 2015-04-08 ENCOUNTER — Ambulatory Visit (HOSPITAL_COMMUNITY): Payer: Self-pay

## 2015-04-08 ENCOUNTER — Ambulatory Visit (HOSPITAL_COMMUNITY)
Admission: RE | Admit: 2015-04-08 | Discharge: 2015-04-08 | Disposition: A | Discharge status: Home / Self Care | Payer: Self-pay | Source: Ambulatory Visit | Attending: Infectious Disease | Admitting: Infectious Disease

## 2015-04-08 ENCOUNTER — Other Ambulatory Visit (HOSPITAL_COMMUNITY): Payer: Self-pay

## 2015-04-08 DIAGNOSIS — Z006 Encounter for examination for normal comparison and control in clinical research program: Secondary | ICD-10-CM

## 2015-04-08 MED ORDER — IOHEXOL 350 MG/ML SOLN: 80.0000 mL | Freq: Once | INTRAVENOUS | Status: AC | PRN

## 2015-04-16 ENCOUNTER — Other Ambulatory Visit: Payer: Self-pay | Admitting: Internal Medicine

## 2015-04-16 ENCOUNTER — Ambulatory Visit (INDEPENDENT_AMBULATORY_CARE_PROVIDER_SITE_OTHER): Payer: Self-pay | Admitting: *Deleted

## 2015-04-16 DIAGNOSIS — Z1231 Encounter for screening mammogram for malignant neoplasm of breast: Secondary | ICD-10-CM

## 2015-04-16 DIAGNOSIS — F329 Major depressive disorder, single episode, unspecified: Secondary | ICD-10-CM

## 2015-04-16 DIAGNOSIS — F32A Depression, unspecified: Secondary | ICD-10-CM

## 2015-04-16 DIAGNOSIS — Z124 Encounter for screening for malignant neoplasm of cervix: Secondary | ICD-10-CM

## 2015-04-16 DIAGNOSIS — Z113 Encounter for screening for infections with a predominantly sexual mode of transmission: Secondary | ICD-10-CM

## 2015-04-16 DIAGNOSIS — B20 Human immunodeficiency virus [HIV] disease: Secondary | ICD-10-CM

## 2015-04-16 NOTE — Progress Notes (Signed)
  Subjective:     Angela Crane is a 54 y.o. woman who comes in today for a  pap smear only.  Previous abnormal Pap smears: no. Contraception: menopausal, condoms.  Objective:    There were no vitals taken for this visit. Pelvic Exam:  Pap smear obtained.   Assessment:    Screening pap smear.   Plan:    Follow up in one year, or as indicated by Pap results.  Pt given educational materials re: HIV and women, self-esteem, BSE, nutrition and diet management, PAP smears and partner safety. Pt given condoms.

## 2015-04-19 LAB — CYTOLOGY - PAP

## 2015-04-21 ENCOUNTER — Encounter: Payer: Self-pay | Admitting: *Deleted

## 2015-04-22 ENCOUNTER — Encounter (INDEPENDENT_AMBULATORY_CARE_PROVIDER_SITE_OTHER): Payer: Self-pay | Admitting: *Deleted

## 2015-04-22 VITALS — BP 118/76 | HR 96 | Temp 98.0°F | Resp 18 | Wt 256.8 lb

## 2015-04-22 DIAGNOSIS — Z006 Encounter for examination for normal comparison and control in clinical research program: Secondary | ICD-10-CM

## 2015-04-22 NOTE — Progress Notes (Signed)
Angela Crane is here for her 1 month Reprieve study visit. She denies any problems with taking her meds or any side effects from them. She started them the day that we attempted the CT scan, which was 4/28. She forgot to bring them for a pill count but will return tomorrow for that. She will come back in 3 months for the next study appt.

## 2015-04-23 LAB — COMPREHENSIVE METABOLIC PANEL
ALK PHOS: 103 U/L (ref 39–117)
ALT: 17 U/L (ref 0–35)
AST: 24 U/L (ref 0–37)
Albumin: 4.2 g/dL (ref 3.5–5.2)
BUN: 7 mg/dL (ref 6–23)
CO2: 27 mEq/L (ref 19–32)
Calcium: 9.5 mg/dL (ref 8.4–10.5)
Chloride: 97 mEq/L (ref 96–112)
Creat: 0.82 mg/dL (ref 0.50–1.10)
Glucose, Bld: 97 mg/dL (ref 70–99)
Potassium: 3.4 mEq/L — ABNORMAL LOW (ref 3.5–5.3)
SODIUM: 138 meq/L (ref 135–145)
Total Bilirubin: 0.4 mg/dL (ref 0.2–1.2)
Total Protein: 8.1 g/dL (ref 6.0–8.3)

## 2015-06-07 ENCOUNTER — Other Ambulatory Visit: Payer: Self-pay

## 2015-06-07 DIAGNOSIS — B20 Human immunodeficiency virus [HIV] disease: Secondary | ICD-10-CM

## 2015-06-15 ENCOUNTER — Other Ambulatory Visit: Payer: Self-pay | Admitting: Internal Medicine

## 2015-06-16 ENCOUNTER — Encounter: Payer: Self-pay | Admitting: Infectious Disease

## 2015-06-22 ENCOUNTER — Ambulatory Visit (INDEPENDENT_AMBULATORY_CARE_PROVIDER_SITE_OTHER): Payer: Self-pay | Admitting: Internal Medicine

## 2015-06-22 ENCOUNTER — Encounter (INDEPENDENT_AMBULATORY_CARE_PROVIDER_SITE_OTHER): Payer: Self-pay | Admitting: *Deleted

## 2015-06-22 ENCOUNTER — Encounter: Payer: Self-pay | Admitting: Internal Medicine

## 2015-06-22 VITALS — BP 128/87 | HR 81 | Temp 98.4°F | Resp 16 | Wt 259.0 lb

## 2015-06-22 DIAGNOSIS — B20 Human immunodeficiency virus [HIV] disease: Secondary | ICD-10-CM

## 2015-06-22 DIAGNOSIS — Z006 Encounter for examination for normal comparison and control in clinical research program: Secondary | ICD-10-CM

## 2015-06-22 LAB — COMPREHENSIVE METABOLIC PANEL
ALT: 20 U/L (ref 0–35)
AST: 25 U/L (ref 0–37)
Albumin: 4 g/dL (ref 3.5–5.2)
Alkaline Phosphatase: 99 U/L (ref 39–117)
BUN: 8 mg/dL (ref 6–23)
CO2: 24 mEq/L (ref 19–32)
Calcium: 9.3 mg/dL (ref 8.4–10.5)
Chloride: 108 mEq/L (ref 96–112)
Creat: 0.68 mg/dL (ref 0.50–1.10)
Glucose, Bld: 86 mg/dL (ref 70–99)
POTASSIUM: 4 meq/L (ref 3.5–5.3)
Sodium: 140 mEq/L (ref 135–145)
TOTAL PROTEIN: 7.4 g/dL (ref 6.0–8.3)
Total Bilirubin: 0.4 mg/dL (ref 0.2–1.2)

## 2015-06-22 LAB — BILIRUBIN,DIRECT & INDIRECT (FRACTIONATED)
Bilirubin, Direct: 0.1 mg/dL (ref 0.0–0.3)
Indirect Bilirubin: 0.3 mg/dL (ref 0.2–1.2)

## 2015-06-22 LAB — PHOSPHORUS: Phosphorus: 2.8 mg/dL (ref 2.3–4.6)

## 2015-06-22 MED ORDER — ELVITEG-COBIC-EMTRICIT-TENOFAF 150-150-200-10 MG PO TABS: 1.0000 | ORAL_TABLET | Freq: Every day | ORAL | Status: DC

## 2015-06-22 NOTE — Progress Notes (Signed)
Angela Crane was here today for screening for A5350 study using probiotics to assess immune function. Informed consent was obtained after she had time to read over the consent and all her questions were answered. She currently denies any problems or any new medications. She did say she may change her Stribild to [facility] in the future which is permissible on this study. She is also coenrolled on the Reprieve study, which is also allowed. She is very adherent with her medications and understands the research process well. She plans to return on July 27th for entry as long as her screening labs are within limits for the study.

## 2015-06-22 NOTE — Progress Notes (Signed)
Subjective:    Patient ID: Angela Crane, female    DOB: 1961/01/27, 54 y.o.   MRN: [id number]  HPI Mrs. Galeno is a 54 y/o female here for HIV f/u. Her last CD4 count in April was 1485/VL <20. She currently is taking her Stribild every day with wide variations in the time she takes her medicine. She states sometimes she will take it with breakfast and sometimes with dinner. At her last visit, we discussed switching her medicines to [facility] to protect her bone and kidney health. She states she is ready to switch today. She also is celebrating becoming an [place] citizen this past December.  Outpatient Encounter Prescriptions as of 06/22/2015  Medication Sig  . amLODipine (NORVASC) 10 MG tablet TAKE 1 TABLET BY MOUTH EVERY DAY  . citalopram (CELEXA) 20 MG tablet TAKE 2 TABLETS BY MOUTH EVERY DAY  . hydrochlorothiazide (HYDRODIURIL) 25 MG tablet TAKE 1 TABLET BY MOUTH EVERY DAY  . loratadine (CLARITIN) 10 MG tablet Take 10 mg by mouth daily.    . naproxen sodium (ANAPROX) 220 MG tablet Take 220 mg by mouth 2 (two) times daily with a meal.  . Pitavastatin Calcium 4 MG TABS Take 1 tablet (4 mg total) by mouth daily.  . [DISCONTINUED] STRIBILD 150-150-200-300 MG TABS tablet TAKE 1 TABLET BY MOUTH DAILY WITH BREAKFAST  . elvitegravir-cobicistat-emtricitabine-tenofovir (GENVOYA) 150-150-200-10 MG TABS tablet Take 1 tablet by mouth daily with breakfast.   No facility-administered encounter medications on file as of 06/22/2015.      Review of Systems  Constitutional: Negative for fever, activity change, fatigue and unexpected weight change.  HENT: Negative for mouth sores, rhinorrhea, sinus pressure, sore throat and trouble swallowing.   Eyes: Negative for photophobia and visual disturbance.  Respiratory: Negative for cough and shortness of breath.   Cardiovascular: Negative for chest pain.  Gastrointestinal: Negative for abdominal pain, diarrhea, constipation and abdominal distention.    Genitourinary: Negative for difficulty urinating.  Musculoskeletal: Negative for myalgias and arthralgias.  Skin: Negative for rash and wound.  Neurological: Negative for dizziness, syncope, weakness and headaches.  Hematological: Negative for adenopathy.  Psychiatric/Behavioral: Negative for suicidal ideas, sleep disturbance, self-injury and agitation. The patient is not nervous/anxious.        Objective:   Physical Exam  Constitutional: She is oriented to person, place, and time. She appears well-developed and well-nourished.  HENT:  Head: Normocephalic and atraumatic.  Mouth/Throat: No oropharyngeal exudate.  Eyes: Conjunctivae are normal. Pupils are equal, round, and reactive to light.  Neck: Normal range of motion. Neck supple.  Cardiovascular: Normal rate and regular rhythm.   Pulmonary/Chest: Effort normal and breath sounds normal.  Abdominal: Soft. Bowel sounds are normal.  Musculoskeletal: Normal range of motion.  Lymphadenopathy:    She has no cervical adenopathy.  Neurological: She is alert and oriented to person, place, and time.  Skin: Skin is warm and dry.  Psychiatric: She has a normal mood and affect. Her behavior is normal. Judgment and thought content normal.   Blood pressure 132/84, pulse 76, temperature 98.6 F (37 C), temperature source Oral, weight 259 lb 12 oz (117.822 kg).   Lab Results  Component Value Date   CD4TABS 1485 03/26/2015   CD4TABS 1546 01/28/2015   CD4TABS 1263 11/14/2014   Lab Results  Component Value Date   HIV1RNAQUANT <20 03/26/2015   CBC    Component Value Date/Time   WBC 6.4 03/04/2015 0940   RBC 4.95 03/04/2015 0940   HGB 13.9 03/04/2015 0940  HCT 40.2 03/04/2015 0940   PLT 330 03/04/2015 0940   MCV 81.2 03/04/2015 0940   MCH 28.1 03/04/2015 0940   MCHC 34.6 03/04/2015 0940   RDW 15.6* 03/04/2015 0940   LYMPHSABS 2.8 03/04/2015 0940   MONOABS 0.3 03/04/2015 0940   EOSABS 0.1 03/04/2015 0940   BASOSABS 0.1  03/04/2015 0940    BMET    Component Value Date/Time   NA 138 04/22/2015 1342   K 3.4* 04/22/2015 1342   CL 97 04/22/2015 1342   CO2 27 04/22/2015 1342   GLUCOSE 97 04/22/2015 1342   BUN 7 04/22/2015 1342   CREATININE 0.82 04/22/2015 1342   CREATININE 0.77 11/02/2010 1420   CALCIUM 9.5 04/22/2015 1342   GFRNONAA 77 12/30/2013 0949   GFRNONAA >60 04/27/2009 2110   GFRAA 89 12/30/2013 0949   GFRAA >60 04/27/2009 2110         Assessment & Plan:  HIV, well controlled: She has been doing well with her current regimen. She states she is ready to switch to [facility] today. She continues with the clinical trials and states she is very happy to be participating in research.  - Switch Stribild to [employer] - Discussed importance of taking her medicines around the same time every day to maintain therapeutic levels in her system - Will recheck labs and have her RTC in 6 months.   HTN: She has been consistently taking her antihypertensive regimens. Blood pressure is well under control with Amlodipine and HCTZ.  - Continue current regimen  Obesity: Has been trying to lose weight. She states she has been going for walks and doing her best to stay active.  - Encouraged to continue physical activity and gradually increase her activity level.  - Encouraged heart healthy diet low in fat and salt.   Addendum: I saw and examined Herbert Pun with Dalene Carrow, NP. Ethelda continues to do very well. She does not miss doses of her Stribild. She is very happy that she recently obtained [place] citizenship. I will switch her to the new preparation called Genvoya and see her back in 6 months.  Michel Bickers, MD [facility] for [facility] [place] [facility] [phone] pager   [phone] cell 06/23/2015, 9:44 AM

## 2015-06-23 LAB — HIV-1 RNA QUANT-NO REFLEX-BLD: HIV-1 RNA Quant, Log: <1.3 {Log} (ref <1.30)

## 2015-06-23 LAB — HEPATITIS B SURFACE ANTIGEN: HEP B S AG: NEGATIVE

## 2015-06-23 LAB — HEPATITIS C ANTIBODY: HCV Ab: NEGATIVE

## 2015-06-23 LAB — HEPATITIS B SURFACE ANTIBODY,QUALITATIVE: Hep B S Ab: POSITIVE — AB

## 2015-07-07 ENCOUNTER — Encounter (INDEPENDENT_AMBULATORY_CARE_PROVIDER_SITE_OTHER): Payer: Self-pay | Admitting: *Deleted

## 2015-07-07 VITALS — BP 111/72 | HR 64 | Temp 98.3°F | Resp 16 | Wt 257.5 lb

## 2015-07-07 DIAGNOSIS — Z006 Encounter for examination for normal comparison and control in clinical research program: Secondary | ICD-10-CM

## 2015-07-07 NOTE — Progress Notes (Signed)
Angela Crane enrolled on the A5350 study today. She denies any new problems or concerns. She will start study product, probiotic Visbiome in 2 weeks.

## 2015-07-21 ENCOUNTER — Encounter (INDEPENDENT_AMBULATORY_CARE_PROVIDER_SITE_OTHER): Payer: Self-pay | Admitting: *Deleted

## 2015-07-21 ENCOUNTER — Encounter: Payer: Self-pay | Admitting: *Deleted

## 2015-07-21 VITALS — BP 116/79 | HR 72 | Temp 98.1°F | Resp 17 | Wt 257.0 lb

## 2015-07-21 DIAGNOSIS — Z006 Encounter for examination for normal comparison and control in clinical research program: Secondary | ICD-10-CM

## 2015-07-21 LAB — COMPREHENSIVE METABOLIC PANEL
ALK PHOS: 114 U/L (ref 33–130)
ALT: 17 U/L (ref 6–29)
AST: 23 U/L (ref 10–35)
Albumin: 4.7 g/dL (ref 3.6–5.1)
BILIRUBIN TOTAL: 0.7 mg/dL (ref 0.2–1.2)
BUN: 11 mg/dL (ref 7–25)
CO2: 25 mmol/L (ref 20–31)
Calcium: 9.5 mg/dL (ref 8.6–10.4)
Chloride: 100 mmol/L (ref 98–110)
Creat: 0.97 mg/dL (ref 0.50–1.05)
Glucose, Bld: 109 mg/dL — ABNORMAL HIGH (ref 65–99)
POTASSIUM: 3.2 mmol/L — AB (ref 3.5–5.3)
SODIUM: 139 mmol/L (ref 135–146)
TOTAL PROTEIN: 8.6 g/dL — AB (ref 6.1–8.1)

## 2015-07-21 LAB — BILIRUBIN,DIRECT & INDIRECT (FRACTIONATED)
BILIRUBIN INDIRECT: 0.6 mg/dL (ref 0.2–1.2)
Bilirubin, Direct: 0.1 mg/dL (ref <=0.2)

## 2015-07-21 NOTE — Progress Notes (Signed)
K3276 study - Safety, Tolerability and Effects of the Probiotic Visbiome Extra Strength on Gut Microbiome and Immune Activation Markers in HIV-infected Participants an Suppressive Antiretroviral Therapy: A Phase 2 study.   D4709 study - Randomized Trial to prevent Vascular Events in HIV (REPRIEVE study).  K9574B substudy - Effects of Pitavastatin on Coronary Artery Disease and Inflammatory Biomarkers.  Angela Crane is here for B4037 Angela Crane), [phone] (month 4). She denies any new symptoms and/or medications. We discussed the addendum to [phone] and I answered her questions. She verbalized understanding and signed the consent.  She denied any muscle aches and/or weakness. She completed all questionnaires for A5350. Vital signs are stable. Fasting blood was obtained. Rectal swab was obtained. Study medications were dispensed and we discussed that she take one sachet per day. This sachet is to be mixed in cold non-carbonated drink and then to drink the whole amount. We discussed side effect like the potential to cause bloating in the first few days. She is to keep this medication in its original bag  And keep in the refrigerator. She verbalized understanding. She received $200 total for all 3 studies. Sent her home with a stool sample kit and hoping to have a sample by tomorrow. Then plan on calling her in 2 weeks to check with adherence and any side effects and then to increase her dose to one sachet, two times per day. Next research appointment will be in one month. Eliezer Champagne RN

## 2015-07-29 MED ORDER — STUDY - INVESTIGATIONAL DRUG SIMPLE RECORD: Status: DC

## 2015-07-29 NOTE — Addendum Note (Signed)
Addended by: Araceli Bouche on: 07/29/2015 09:26 AM   Modules accepted: Orders

## 2015-08-11 ENCOUNTER — Encounter: Payer: Self-pay | Admitting: Internal Medicine

## 2015-08-11 LAB — HIV-1 RNA QUANT-NO REFLEX-BLD: HIV-1 RNA Viral Load: <40

## 2015-08-17 VITALS — BP 123/84 | HR 83 | Temp 98.4°F | Resp 16 | Wt 258.0 lb

## 2015-08-17 DIAGNOSIS — Z006 Encounter for examination for normal comparison and control in clinical research program: Secondary | ICD-10-CM

## 2015-09-13 ENCOUNTER — Other Ambulatory Visit: Payer: Self-pay | Admitting: Internal Medicine

## 2015-09-16 NOTE — Progress Notes (Unsigned)
Calah is here for A5350 study, week 6. States she has had some bloating in the first week that she started study drug. No other issues noted. Vitals obtained; fasting blood and other samples obtained; Questionnaires were completed; Sachet count was not performed. Participant thought she needed to bring the big pouch that contained the sachets and she threw away the sachets. I explained to her that the sachets are what we need to count and to bring these at the next visit. She verbalized understanding. She stated she had not missed a dose. She received $50 gift gift card for visit. Next appointment scheduled for November 22 @ 9:30am. Eliezer Champagne RN

## 2015-10-13 ENCOUNTER — Encounter: Payer: Self-pay | Admitting: *Deleted

## 2015-10-13 ENCOUNTER — Ambulatory Visit (INDEPENDENT_AMBULATORY_CARE_PROVIDER_SITE_OTHER): Payer: Self-pay | Admitting: *Deleted

## 2015-10-13 VITALS — BP 120/77 | HR 94 | Temp 98.7°F | Resp 17 | Wt 250.8 lb

## 2015-10-13 DIAGNOSIS — Z0000 Encounter for general adult medical examination without abnormal findings: Secondary | ICD-10-CM

## 2015-10-13 DIAGNOSIS — Z006 Encounter for examination for normal comparison and control in clinical research program: Secondary | ICD-10-CM

## 2015-10-13 DIAGNOSIS — Z23 Encounter for immunization: Secondary | ICD-10-CM

## 2015-10-13 LAB — COMPREHENSIVE METABOLIC PANEL
ALBUMIN: 4.3 g/dL (ref 3.6–5.1)
ALT: 21 U/L (ref 6–29)
AST: 27 U/L (ref 10–35)
Alkaline Phosphatase: 116 U/L (ref 33–130)
BUN: 9 mg/dL (ref 7–25)
CALCIUM: 9.4 mg/dL (ref 8.6–10.4)
CHLORIDE: 93 mmol/L — AB (ref 98–110)
CO2: 30 mmol/L (ref 20–31)
Creat: 0.98 mg/dL (ref 0.50–1.05)
Glucose, Bld: 160 mg/dL — ABNORMAL HIGH (ref 65–99)
POTASSIUM: 3.3 mmol/L — AB (ref 3.5–5.3)
Sodium: 137 mmol/L (ref 135–146)
Total Bilirubin: 0.4 mg/dL (ref 0.2–1.2)
Total Protein: 8 g/dL (ref 6.1–8.1)

## 2015-10-13 LAB — BILIRUBIN,DIRECT & INDIRECT (FRACTIONATED)
BILIRUBIN DIRECT: 0.1 mg/dL (ref <=0.2)
BILIRUBIN INDIRECT: 0.3 mg/dL (ref 0.2–1.2)

## 2015-10-13 LAB — PHOSPHORUS: Phosphorus: 4.4 mg/dL (ref 2.5–4.5)

## 2015-10-13 LAB — HIV-1 RNA QUANT-NO REFLEX-BLD

## 2015-10-13 NOTE — Progress Notes (Signed)
Study A5350: Safety, Tolerability and Effects of the Probiotic Visbiome Extra Strength on Gut Microbiome and Immune Activation Markers in HIV-infected Participants an Suppressive Antiretroviral Therapy: A Phase 2 study.  Medication: Visbiome ES or placebo, 1 sachet BID Duration: 38 weeks (10 months)  Angela Crane is here for A5350, week 14. She denies any new problems or issues. She has had about an 8 lb weight loss and states she has not attempted to lose weight however she has felt that she is eating better choices. Fasting blood obtained, questionnaires completed. Vital signs are stable. #94 sachets were returned; she did state that a few unopened sachets were at home. She was unable to provide [place] a stool sample today but hopes to bring one in tomorrow. She received $50 gift card for visit and next appointment is 12/29/2015 @ 9:30am. Flu vaccine given after blood drawn. Eliezer Champagne RN

## 2015-10-15 ENCOUNTER — Other Ambulatory Visit: Payer: Self-pay | Admitting: Internal Medicine

## 2015-10-15 DIAGNOSIS — F329 Major depressive disorder, single episode, unspecified: Secondary | ICD-10-CM

## 2015-10-15 DIAGNOSIS — F32A Depression, unspecified: Secondary | ICD-10-CM

## 2015-10-21 ENCOUNTER — Encounter: Payer: Self-pay | Admitting: Internal Medicine

## 2015-10-21 LAB — CD4/CD8 (T-HELPER/T-SUPPRESSOR CELL)
CD4 T CELL HELPER: 44.1
CD4: 1147
CD8 T CELL SUPPRESSOR: 21.4
CD8: 556

## 2015-11-03 ENCOUNTER — Encounter: Payer: Self-pay | Admitting: Internal Medicine

## 2015-11-09 ENCOUNTER — Encounter: Payer: Self-pay | Admitting: Internal Medicine

## 2015-11-18 ENCOUNTER — Encounter (INDEPENDENT_AMBULATORY_CARE_PROVIDER_SITE_OTHER): Payer: Self-pay | Admitting: *Deleted

## 2015-11-18 ENCOUNTER — Encounter: Payer: Self-pay | Admitting: *Deleted

## 2015-11-18 VITALS — BP 124/84 | HR 63 | Temp 98.8°F | Resp 16 | Wt 257.2 lb

## 2015-11-18 DIAGNOSIS — Z006 Encounter for examination for normal comparison and control in clinical research program: Secondary | ICD-10-CM

## 2015-11-18 NOTE — Progress Notes (Signed)
Angela Crane is here for her Month 8 Reprieve study visit. She says she hasn't noticed any side effects of the medication and never misses a dose. She had fallen down the stairs before Thanksgiving and hurt her left leg. She said she did not seek treatment at the time because it didn't feel like it was broken. She is still having some residual pain but has gradually gotten better. She said she had switched her ARVS to [place] about 3 months ago. She will return for Reprieve in April.

## 2015-12-14 ENCOUNTER — Other Ambulatory Visit: Payer: Self-pay | Admitting: Internal Medicine

## 2015-12-14 DIAGNOSIS — I10 Essential (primary) hypertension: Secondary | ICD-10-CM

## 2015-12-29 ENCOUNTER — Encounter (INDEPENDENT_AMBULATORY_CARE_PROVIDER_SITE_OTHER): Payer: Self-pay | Admitting: *Deleted

## 2015-12-29 VITALS — Wt 251.8 lb

## 2015-12-29 DIAGNOSIS — Z006 Encounter for examination for normal comparison and control in clinical research program: Secondary | ICD-10-CM

## 2015-12-29 NOTE — Progress Notes (Signed)
Angela Crane is here for A5350, week 25. She denies any new problems or medication changes. She has been adherent with her study meds. 24 dietary assessment was completed thru the website. Blood drawn with no problems. She was given $50 gift card for her visit. Next appointment in one week. Eliezer Champagne RN

## 2016-01-05 ENCOUNTER — Encounter (INDEPENDENT_AMBULATORY_CARE_PROVIDER_SITE_OTHER): Payer: Self-pay | Admitting: *Deleted

## 2016-01-05 VITALS — BP 117/77 | HR 71 | Temp 98.2°F | Resp 18 | Wt 256.5 lb

## 2016-01-05 DIAGNOSIS — Z006 Encounter for examination for normal comparison and control in clinical research program: Secondary | ICD-10-CM

## 2016-01-05 LAB — BILIRUBIN,DIRECT & INDIRECT (FRACTIONATED)
BILIRUBIN INDIRECT: 0.4 mg/dL (ref 0.2–1.2)
Bilirubin, Direct: 0.1 mg/dL (ref <=0.2)

## 2016-01-05 LAB — COMPREHENSIVE METABOLIC PANEL
ALBUMIN: 4.1 g/dL (ref 3.6–5.1)
ALK PHOS: 92 U/L (ref 33–130)
ALT: 14 U/L (ref 6–29)
AST: 22 U/L (ref 10–35)
BILIRUBIN TOTAL: 0.5 mg/dL (ref 0.2–1.2)
BUN: 11 mg/dL (ref 7–25)
CO2: 27 mmol/L (ref 20–31)
Calcium: 9.3 mg/dL (ref 8.6–10.4)
Chloride: 102 mmol/L (ref 98–110)
Creat: 0.67 mg/dL (ref 0.50–1.05)
Glucose, Bld: 90 mg/dL (ref 65–99)
Potassium: 4.1 mmol/L (ref 3.5–5.3)
SODIUM: 140 mmol/L (ref 135–146)
Total Protein: 8 g/dL (ref 6.1–8.1)

## 2016-01-05 LAB — PHOSPHORUS: Phosphorus: 3.6 mg/dL (ref 2.5–4.5)

## 2016-01-05 NOTE — Progress Notes (Addendum)
Research Study [phone]: Safety, Tolerability and Effects of the Probiotic Visbiome Extra Strength on Gut Microbiome and Immune Activation Markers in HIV-infected Participants an Suppressive Antiretroviral Therapy: A Phase 2 study. Duration = 38 weeks.   Angela Crane is here for A5350, week 80. She denies any new problems, symptoms/signs. She brings in her empty sachets #146 but did not bring in her unused sachets. Vital signs obtained, blood drawn, and questionnaires completed. Did received stool sample from this morning at 6am. This will be her last day taking study drug and will see her for her final study visit in 3 months. She received $50 gift card for her visit and appointment scheduled for April 14th @ 9am. Eliezer Champagne RN

## 2016-01-19 ENCOUNTER — Encounter: Payer: Self-pay | Admitting: Internal Medicine

## 2016-01-19 ENCOUNTER — Encounter: Payer: Self-pay | Admitting: Infectious Disease

## 2016-01-19 LAB — CD4/CD8 (T-HELPER/T-SUPPRESSOR CELL)
CD4%: 46.4
CD4: 1485
CD8 T CELL SUPPRESSOR: 22.6
CD8: 723

## 2016-01-19 LAB — HIV-1 RNA QUANT-NO REFLEX-BLD: HIV-1 RNA Viral Load: <40

## 2016-02-02 ENCOUNTER — Other Ambulatory Visit: Payer: Self-pay | Admitting: Internal Medicine

## 2016-02-02 DIAGNOSIS — Z1231 Encounter for screening mammogram for malignant neoplasm of breast: Secondary | ICD-10-CM

## 2016-02-09 ENCOUNTER — Ambulatory Visit
Admission: RE | Admit: 2016-02-09 | Discharge: 2016-02-09 | Disposition: A | Discharge status: Home / Self Care | Payer: No Typology Code available for payment source | Source: Ambulatory Visit | Attending: Internal Medicine | Admitting: Internal Medicine

## 2016-02-09 DIAGNOSIS — Z1231 Encounter for screening mammogram for malignant neoplasm of breast: Secondary | ICD-10-CM

## 2016-03-21 ENCOUNTER — Ambulatory Visit: Payer: Self-pay | Admitting: Internal Medicine

## 2016-03-21 ENCOUNTER — Encounter (INDEPENDENT_AMBULATORY_CARE_PROVIDER_SITE_OTHER): Payer: Self-pay | Admitting: *Deleted

## 2016-03-21 VITALS — BP 136/85 | HR 71 | Temp 98.2°F | Resp 16 | Wt 261.5 lb

## 2016-03-21 DIAGNOSIS — Z006 Encounter for examination for normal comparison and control in clinical research program: Secondary | ICD-10-CM

## 2016-03-21 LAB — COMPREHENSIVE METABOLIC PANEL
ALBUMIN: 4.1 g/dL (ref 3.6–5.1)
ALT: 15 U/L (ref 6–29)
AST: 22 U/L (ref 10–35)
Alkaline Phosphatase: 88 U/L (ref 33–130)
BILIRUBIN TOTAL: 0.5 mg/dL (ref 0.2–1.2)
BUN: 7 mg/dL (ref 7–25)
CO2: 25 mmol/L (ref 20–31)
CREATININE: 0.81 mg/dL (ref 0.50–1.05)
Calcium: 9.3 mg/dL (ref 8.6–10.4)
Chloride: 103 mmol/L (ref 98–110)
Glucose, Bld: 86 mg/dL (ref 65–99)
Potassium: 4.2 mmol/L (ref 3.5–5.3)
SODIUM: 139 mmol/L (ref 135–146)
Total Protein: 7.1 g/dL (ref 6.1–8.1)

## 2016-03-21 LAB — CD4/CD8 (T-HELPER/T-SUPPRESSOR CELL)
CD4%: 50.2
CD4: 1958
CD8 % Suppressor T Cell: 21.7
CD8: 846

## 2016-03-21 LAB — HIV-1 RNA QUANT-NO REFLEX-BLD: HIV 1 RNA VIRAL LOAD: 41

## 2016-03-21 NOTE — Progress Notes (Signed)
Angela Crane is here for her final visit for A5350 and month 12 visit for Reprieve, A Randomized Trial to Prevent Vascular Events in HIV (study drug is Pitavastatin 4mg  or placebo). She says she has had excellent adherence with her pitavastatin, but forgot to bring her study pills back with her today. She denies any new problems or concerns. She was not able to get a stool specimen for the A5350 study at this visit. She has agreed to enter the substudy for Reprieve, called Prepare which is an observational study of frailty over a 4 year period. She will enroll on that at the next visit in August.

## 2016-03-27 ENCOUNTER — Ambulatory Visit (INDEPENDENT_AMBULATORY_CARE_PROVIDER_SITE_OTHER): Payer: Self-pay | Admitting: Internal Medicine

## 2016-03-27 ENCOUNTER — Encounter: Payer: Self-pay | Admitting: Internal Medicine

## 2016-03-27 VITALS — BP 137/83 | HR 81 | Temp 98.8°F | Wt 262.0 lb

## 2016-03-27 DIAGNOSIS — E669 Obesity, unspecified: Secondary | ICD-10-CM

## 2016-03-27 DIAGNOSIS — B20 Human immunodeficiency virus [HIV] disease: Secondary | ICD-10-CM

## 2016-03-27 NOTE — Assessment & Plan Note (Addendum)
Patient would like to lose weight cautiously.  She has began to incorporate 10-12 minutes of walking everyday, which she tolerates well.  Therefore, patient was encouraged to increase the amount of time spent exercising and length of distance walked.  Additionally, discussed she should walk briskly to ensure her heart/respiratory rate become elevated enough for aerobic benefits.  Patient verbalizes understanding and is motivated to lose weight.

## 2016-03-27 NOTE — Assessment & Plan Note (Addendum)
Patient presents to clinic for routine follow-up.  Discussed with patient her most recent CD4 and VL.  She will continue her Genvoya as ordered.  Patient will return to clinic in 6 months with labs prior to visit.  Addendum: I have seen and examined Angela Crane. She is doing very well and her HIV infection remains under excellent control. She is hoping to lose some weight. She is walking for exercise but usually only 10-12 minutes daily and had not had a very strenuous pace. I encouraged her to increase the frequency with which she gets exercise and encouraged her to walk for longer periods of time. She states that her daughter is an exercise addict. I suggested that she have her daughter give her some coaching advice.

## 2016-03-27 NOTE — Progress Notes (Signed)
   Subjective:    Patient ID: Angela Crane, female    DOB: 16-Jan-1961, 55 y.o.   MRN: [id number]  HPI Angela Crane is a 55 year old female diagnosed with HIV.  She was last seen by Dr. Megan Salon on 06/22/15.  Previously, her ART regimen consisted of Stribild; however, for modernization she was prescribed Genvoya at her last visit.  Patient denies any issues receiving her medication.  She denies any side effects and reports tolerating Genvoya well.  She takes her medication every morning at 8 am with breakfast and denies any missed doses.  She presents to clinic today for routine follow-up.  She reports feeling well; however, would like to address her weight gain of ~6 pounds.  Her priority is to "lose weight cautiously."  She has began to make changes to her diet (increased vegetables/fruits and decreased carb intake) and exercise (walks ~10-12 minutes per day).  Because she works from 11pm-7am, sleeps 7am-10am and 5pm-9pm the best time for her to exercise is 10am-5pm.  Lab Results  Component Value Date   HIV1RNAQUANT <20 06/22/2015   Lab Results  Component Value Date   CD4TABS 1485 01/05/2016   CD4TABS 1147 10/13/2015   CD4TABS 1485 03/26/2015   Review of Systems  Constitutional: Negative for fever and chills.  HENT: Negative for mouth sores and sore throat.   Respiratory: Negative for cough and shortness of breath.   Cardiovascular: Negative for chest pain and leg swelling.  Gastrointestinal: Negative for nausea, diarrhea and constipation.  Genitourinary: Negative for dysuria.  Skin: Negative for rash.  Neurological: Negative for headaches.      Objective:   Physical Exam  Constitutional: She is oriented to person, place, and time. She appears well-developed and well-nourished.  She is in good spirits and reports feeling well.  HENT:  Mouth/Throat: No oropharyngeal exudate.  Cardiovascular: Normal rate, regular rhythm and normal heart sounds.   No murmur heard. Pulmonary/Chest:  Effort normal and breath sounds normal. No respiratory distress. She has no wheezes.  Abdominal: Soft. Bowel sounds are normal. There is no tenderness.  Musculoskeletal: She exhibits no edema.  Lymphadenopathy:    She has no cervical adenopathy.  Neurological: She is alert and oriented to person, place, and time.  Psychiatric: She has a normal mood and affect.      Assessment & Plan:

## 2016-03-29 ENCOUNTER — Encounter: Payer: Self-pay | Admitting: Internal Medicine

## 2016-04-11 ENCOUNTER — Other Ambulatory Visit: Payer: Self-pay | Admitting: Internal Medicine

## 2016-04-14 ENCOUNTER — Encounter: Payer: Self-pay | Admitting: Internal Medicine

## 2016-06-10 ENCOUNTER — Other Ambulatory Visit: Payer: Self-pay | Admitting: Internal Medicine

## 2016-06-10 DIAGNOSIS — I10 Essential (primary) hypertension: Secondary | ICD-10-CM

## 2016-06-10 DIAGNOSIS — B20 Human immunodeficiency virus [HIV] disease: Secondary | ICD-10-CM

## 2016-07-08 ENCOUNTER — Other Ambulatory Visit: Payer: Self-pay | Admitting: Internal Medicine

## 2016-07-14 ENCOUNTER — Encounter (INDEPENDENT_AMBULATORY_CARE_PROVIDER_SITE_OTHER): Payer: Self-pay | Admitting: *Deleted

## 2016-07-14 VITALS — BP 143/80 | HR 87 | Temp 98.2°F | Wt 258.2 lb

## 2016-07-14 DIAGNOSIS — Z006 Encounter for examination for normal comparison and control in clinical research program: Secondary | ICD-10-CM

## 2016-07-14 NOTE — Progress Notes (Signed)
Angela Crane is here for her month 16 visit for A5332, A Randomized Trial to Prevent Vascular Events in HIV (The REPRIEVE Study). States excellent adherence with her study medication. No new complaints or concerns verbalized today. She did enroll in the Prepare [phone]) substudy today (Pitavastatin to REduce Physical Function Impairment and FRailty in HIV). This study includes physical frailty testing once a year while in Reprieve. Next visit is scheduled for 12/6 @ 9:00am.

## 2016-07-20 ENCOUNTER — Encounter: Payer: Self-pay | Admitting: Internal Medicine

## 2016-08-12 ENCOUNTER — Other Ambulatory Visit: Payer: Self-pay | Admitting: Internal Medicine

## 2016-08-12 DIAGNOSIS — I10 Essential (primary) hypertension: Secondary | ICD-10-CM

## 2016-10-03 ENCOUNTER — Other Ambulatory Visit (INDEPENDENT_AMBULATORY_CARE_PROVIDER_SITE_OTHER): Payer: Self-pay

## 2016-10-03 ENCOUNTER — Ambulatory Visit (INDEPENDENT_AMBULATORY_CARE_PROVIDER_SITE_OTHER): Payer: Self-pay | Admitting: Licensed Clinical Social Worker

## 2016-10-03 ENCOUNTER — Other Ambulatory Visit: Payer: No Typology Code available for payment source

## 2016-10-03 DIAGNOSIS — B20 Human immunodeficiency virus [HIV] disease: Secondary | ICD-10-CM

## 2016-10-03 DIAGNOSIS — Z23 Encounter for immunization: Secondary | ICD-10-CM

## 2016-10-03 LAB — CBC
HEMATOCRIT: 40 % (ref 35.0–45.0)
HEMOGLOBIN: 13.4 g/dL (ref 11.7–15.5)
MCH: 28.4 pg (ref 27.0–33.0)
MCHC: 33.5 g/dL (ref 32.0–36.0)
MCV: 84.7 fL (ref 80.0–100.0)
MPV: 9.9 fL (ref 7.5–12.5)
Platelets: 320 10*3/uL (ref 140–400)
RBC: 4.72 MIL/uL (ref 3.80–5.10)
RDW: 15.7 % — ABNORMAL HIGH (ref 11.0–15.0)
WBC: 8.1 10*3/uL (ref 3.8–10.8)

## 2016-10-04 LAB — COMPREHENSIVE METABOLIC PANEL
ALBUMIN: 4.2 g/dL (ref 3.6–5.1)
ALT: 11 U/L (ref 6–29)
AST: 21 U/L (ref 10–35)
Alkaline Phosphatase: 84 U/L (ref 33–130)
BILIRUBIN TOTAL: 0.4 mg/dL (ref 0.2–1.2)
BUN: 9 mg/dL (ref 7–25)
CALCIUM: 9.4 mg/dL (ref 8.6–10.4)
CHLORIDE: 102 mmol/L (ref 98–110)
CO2: 28 mmol/L (ref 20–31)
Creat: 0.86 mg/dL (ref 0.50–1.05)
Glucose, Bld: 87 mg/dL (ref 65–99)
Potassium: 4.1 mmol/L (ref 3.5–5.3)
Sodium: 140 mmol/L (ref 135–146)
Total Protein: 7.4 g/dL (ref 6.1–8.1)

## 2016-10-04 LAB — T-HELPER CELLS (CD4) COUNT (NOT AT ARMC)
CD4 T CELL ABS: 2170 /uL (ref 400–2700)
CD4 T CELL HELPER: 49 % (ref 33–55)

## 2016-10-05 LAB — HIV-1 RNA QUANT-NO REFLEX-BLD
HIV 1 RNA Quant: <20 copies/mL (ref <20)
HIV-1 RNA Quant, Log: <1.3 Log copies/mL (ref <1.30)

## 2016-10-17 ENCOUNTER — Encounter: Payer: Self-pay | Admitting: Internal Medicine

## 2016-10-17 ENCOUNTER — Ambulatory Visit (INDEPENDENT_AMBULATORY_CARE_PROVIDER_SITE_OTHER): Payer: Self-pay | Admitting: Internal Medicine

## 2016-10-17 ENCOUNTER — Encounter (INDEPENDENT_AMBULATORY_CARE_PROVIDER_SITE_OTHER): Payer: Self-pay | Admitting: *Deleted

## 2016-10-17 VITALS — BP 136/88 | HR 75 | Temp 98.2°F | Wt 264.2 lb

## 2016-10-17 DIAGNOSIS — B20 Human immunodeficiency virus [HIV] disease: Secondary | ICD-10-CM

## 2016-10-17 DIAGNOSIS — Z006 Encounter for examination for normal comparison and control in clinical research program: Secondary | ICD-10-CM

## 2016-10-17 NOTE — Progress Notes (Signed)
Angela Crane is here today to screen for the [phone]- Identification of Biomarkers to Predict Time to Plasma HIV RNA Rebound and Post-Treatment Viral Control during an Intensely Monitored Antirretroviral Pause Study. After she was able to review the consent and we discussed it in detail, informed consent was obtained. She understands that she will stop her HIV medications and that we will have her come in twice a week for lab draws to see how fast her viral load rebounds. We will resume her medications once her viral load goes over 1000. She denies any new problems or concerns. I will get back with her to schedule entry once it is determined she is eligible.

## 2016-10-17 NOTE — Progress Notes (Signed)
Patient Active Problem List   Diagnosis Date Noted  . Human immunodeficiency virus (HIV) disease ([place]) 05/20/2007    Priority: High  . Vaginal pruritus 09/04/2013  . Perimenopausal atrophic vaginitis 09/04/2013  . Menopausal and perimenopausal disorder   . Obesity 11/08/2011  . Dyslipidemia 05/11/2011  . ESSENTIAL HYPERTENSION 04/27/2010  . ABSCESS, TOOTH 10/09/2008  . COUGH 04/22/2008  . DISORDER, DEPRESSIVE NEC 05/20/2007    Patient's Medications  New Prescriptions   No medications on file  Previous Medications   AMLODIPINE (NORVASC) 10 MG TABLET    TAKE 1 TABLET BY MOUTH EVERY DAY   CITALOPRAM (CELEXA) 20 MG TABLET    TAKE 2 TABLETS BY MOUTH EVERY DAY   GENVOYA 150-150-200-10 MG TABS TABLET    TAKE 1 TABLET BY MOUTH DAILY WITH BREAKFAST   HYDROCHLOROTHIAZIDE (HYDRODIURIL) 25 MG TABLET    TAKE 1 TABLET BY MOUTH EVERY DAY   LORATADINE (CLARITIN) 10 MG TABLET    Take 10 mg by mouth daily.     NAPROXEN SODIUM (ANAPROX) 220 MG TABLET    Take 220 mg by mouth 2 (two) times daily with a meal.   PITAVASTATIN CALCIUM 4 MG TABS    Take 1 tablet (4 mg total) by mouth daily.  Modified Medications   No medications on file  Discontinued Medications   No medications on file    Subjective: Angela Crane is in for her routine HIV follow-up visit. She's had no problems obtaining, taking or tolerating her Genvoya. She takes it each morning around 9 AM with breakfast. She does not recall seeing any doses. She's not changed any other medications. She is still trying to lose weight by exercising more. She denies any current concerns or questions about her health.   Review of Systems: Review of Systems  Constitutional: Negative for chills, diaphoresis, fever, malaise/fatigue and weight loss.  HENT: Negative for sore throat.   Respiratory: Negative for cough, sputum production and shortness of breath.   Cardiovascular: Negative for chest pain.  Gastrointestinal: Negative for abdominal  pain, diarrhea, nausea and vomiting.  Genitourinary: Negative for dysuria.  Musculoskeletal: Negative for joint pain and myalgias.  Skin: Negative for rash.  Neurological: Negative for dizziness and headaches.  Psychiatric/Behavioral: Negative for depression and substance abuse. The patient is not nervous/anxious.     Past Medical History:  Diagnosis Date  . HIV (human immunodeficiency virus infection) ([place])   . Hypertension   . Menopausal and perimenopausal disorder     Social History  Substance Use Topics  . Smoking status: Never Smoker  . Smokeless tobacco: Never Used  . Alcohol use No    Family History  Problem Relation Age of Onset  . HIV Daughter   . Diabetes Mother     Allergies  Allergen Reactions  . Sulfonamide Derivatives     REACTION: dangerous inflamation    Objective:  Vitals:   10/17/16 0910  BP: 140/84  Pulse: 89  Weight: 261 lb (118.4 kg)  Height: 5\' 7"  (1.702 m)   Body mass index is 40.88 kg/m.  Physical Exam  Constitutional: She is oriented to person, place, and time.  She has gained 2 pounds since her last visit.  HENT:  Mouth/Throat: No oropharyngeal exudate.  Eyes: Conjunctivae are normal.  Cardiovascular: Normal rate and regular rhythm.   No murmur heard. Pulmonary/Chest: Effort normal and breath sounds normal.  Abdominal: Soft. She exhibits no mass. There is no tenderness.  Musculoskeletal: Normal range  of motion.  Neurological: She is alert and oriented to person, place, and time.  Skin: No rash noted.  Psychiatric: Mood and affect normal.    Lab Results Lab Results  Component Value Date   WBC 8.1 10/03/2016   HGB 13.4 10/03/2016   HCT 40.0 10/03/2016   MCV 84.7 10/03/2016   PLT 320 10/03/2016    Lab Results  Component Value Date   CREATININE 0.86 10/03/2016   BUN 9 10/03/2016   NA 140 10/03/2016   K 4.1 10/03/2016   CL 102 10/03/2016   CO2 28 10/03/2016    Lab Results  Component Value Date   ALT 11 10/03/2016     AST 21 10/03/2016   ALKPHOS 84 10/03/2016   BILITOT 0.4 10/03/2016    Lab Results  Component Value Date   CHOL 197 03/04/2015   HDL 43 (L) 03/04/2015   LDLCALC 123 (H) 03/04/2015   TRIG 156 (H) 03/04/2015   CHOLHDL 4.6 03/04/2015   HIV 1 RNA Quant (copies/mL)  Date Value  10/03/2016 <20  06/22/2015 <20  03/26/2015 <20   HIV-1 RNA Viral Load (no units)  Date Value  03/21/2016 41  01/05/2016 <40  10/13/2015 <40   CD4 (no units)  Date Value  03/21/2016 1,958  01/05/2016 1,485  10/13/2015 1,147   CD4 T Cell Abs (/uL)  Date Value  10/03/2016 2,170     Problem List Items Addressed This Visit      High   Human immunodeficiency virus (HIV) disease ([place])    Her infection has been under excellent, long-term control. She received her influenza vaccination today. She will continue Genvoya in follow-up after blood work in one year      Relevant Orders   T-helper cell (CD4)- ([facility] clinic only)   HIV 1 RNA quant-no reflex-bld   CBC   Comprehensive metabolic panel   Lipid panel   RPR        Michel Bickers, MD [facility] for [place] [phone] pager   [phone] cell 10/17/2016, 9:39 AM

## 2016-10-17 NOTE — Assessment & Plan Note (Signed)
Her infection has been under excellent, long-term control. She received her influenza vaccination today. She will continue Genvoya in follow-up after blood work in one year

## 2016-10-18 LAB — COMPREHENSIVE METABOLIC PANEL
ALBUMIN: 4.2 g/dL (ref 3.6–5.1)
ALT: 15 U/L (ref 6–29)
AST: 23 U/L (ref 10–35)
Alkaline Phosphatase: 88 U/L (ref 33–130)
BUN: 9 mg/dL (ref 7–25)
CHLORIDE: 102 mmol/L (ref 98–110)
CO2: 27 mmol/L (ref 20–31)
CREATININE: 0.79 mg/dL (ref 0.50–1.05)
Calcium: 9.3 mg/dL (ref 8.6–10.4)
Glucose, Bld: 92 mg/dL (ref 65–99)
POTASSIUM: 4.2 mmol/L (ref 3.5–5.3)
SODIUM: 139 mmol/L (ref 135–146)
TOTAL PROTEIN: 7.3 g/dL (ref 6.1–8.1)
Total Bilirubin: 0.3 mg/dL (ref 0.2–1.2)

## 2016-10-18 LAB — HEPATITIS B SURFACE ANTIGEN: HEP B S AG: NEGATIVE

## 2016-10-18 LAB — HEPATITIS C ANTIBODY: HCV AB: NEGATIVE

## 2016-10-19 LAB — HIV-1 RNA QUANT-NO REFLEX-BLD
HIV 1 RNA QUANT: 20 {copies}/mL (ref <20)
HIV-1 RNA QUANT, LOG: 1.3 {Log_copies}/mL (ref <1.30)

## 2016-11-11 ENCOUNTER — Other Ambulatory Visit: Payer: Self-pay | Admitting: Internal Medicine

## 2016-11-11 DIAGNOSIS — F32A Depression, unspecified: Secondary | ICD-10-CM

## 2016-11-11 DIAGNOSIS — F329 Major depressive disorder, single episode, unspecified: Secondary | ICD-10-CM

## 2016-11-15 ENCOUNTER — Encounter (INDEPENDENT_AMBULATORY_CARE_PROVIDER_SITE_OTHER): Payer: Self-pay | Admitting: Licensed Clinical Social Worker

## 2016-11-15 VITALS — BP 136/82 | HR 77 | Temp 98.0°F | Wt 267.2 lb

## 2016-11-15 DIAGNOSIS — Z006 Encounter for examination for normal comparison and control in clinical research program: Secondary | ICD-10-CM

## 2016-11-15 NOTE — Progress Notes (Signed)
Angela Crane comes in today for  reprieve study month 20. Patient states she is doing well and taking her medication everyday without missing pills. She denies any muscle aches or pain. Patient will return in April.

## 2016-12-10 ENCOUNTER — Other Ambulatory Visit: Payer: Self-pay | Admitting: Internal Medicine

## 2016-12-10 DIAGNOSIS — B20 Human immunodeficiency virus [HIV] disease: Secondary | ICD-10-CM

## 2016-12-10 DIAGNOSIS — I10 Essential (primary) hypertension: Secondary | ICD-10-CM

## 2017-02-09 ENCOUNTER — Other Ambulatory Visit: Payer: Self-pay | Admitting: Internal Medicine

## 2017-02-09 DIAGNOSIS — I10 Essential (primary) hypertension: Secondary | ICD-10-CM

## 2017-03-09 ENCOUNTER — Other Ambulatory Visit: Payer: Self-pay | Admitting: Internal Medicine

## 2017-03-09 DIAGNOSIS — F32A Depression, unspecified: Secondary | ICD-10-CM

## 2017-03-09 DIAGNOSIS — F329 Major depressive disorder, single episode, unspecified: Secondary | ICD-10-CM

## 2017-03-15 ENCOUNTER — Encounter (INDEPENDENT_AMBULATORY_CARE_PROVIDER_SITE_OTHER): Payer: Self-pay | Admitting: *Deleted

## 2017-03-15 VITALS — BP 143/92 | HR 79 | Temp 98.0°F | Resp 16 | Ht 68.0 in | Wt 271.0 lb

## 2017-03-15 DIAGNOSIS — Z006 Encounter for examination for normal comparison and control in clinical research program: Secondary | ICD-10-CM

## 2017-03-15 LAB — CD4/CD8 (T-HELPER/T-SUPPRESSOR CELL)
CD4 % Helper T Cell: 50.7
CD4 Count: 1825
CD8 % Suppressor T Cell: 20.4
CD8 T CELL ABS: 734

## 2017-03-15 LAB — COMPREHENSIVE METABOLIC PANEL
ALK PHOS: 72 U/L (ref 33–130)
ALT: 13 U/L (ref 6–29)
AST: 22 U/L (ref 10–35)
Albumin: 4.1 g/dL (ref 3.6–5.1)
BUN: 11 mg/dL (ref 7–25)
CALCIUM: 9.4 mg/dL (ref 8.6–10.4)
CHLORIDE: 105 mmol/L (ref 98–110)
CO2: 27 mmol/L (ref 20–31)
Creat: 0.83 mg/dL (ref 0.50–1.05)
Glucose, Bld: 90 mg/dL (ref 65–99)
POTASSIUM: 4.7 mmol/L (ref 3.5–5.3)
Sodium: 141 mmol/L (ref 135–146)
TOTAL PROTEIN: 7.4 g/dL (ref 6.1–8.1)
Total Bilirubin: 0.4 mg/dL (ref 0.2–1.2)

## 2017-03-15 NOTE — Progress Notes (Signed)
Angela Crane was here for month 24 of the Reprieve study and substudies [phone] and [phone]. Unfortunately at this visit she is too heavy for the study CT that is due now. She denies any new problems or concerns. I did tell her about [phone], a new cure study coming that is using vorinostat and tamoxifen. She is interested in participating, and will need to lose 10 lbs to be eligible, so this may motivate her to lose some weight. She will return in July for the next Reprieve study visit.

## 2017-03-19 ENCOUNTER — Other Ambulatory Visit: Payer: Self-pay | Admitting: *Deleted

## 2017-03-19 DIAGNOSIS — B20 Human immunodeficiency virus [HIV] disease: Secondary | ICD-10-CM

## 2017-03-19 LAB — HIV-1 RNA QUANT-NO REFLEX-BLD
HIV 1 RNA QUANT: 78 {copies}/mL — AB
HIV-1 RNA QUANT, LOG: 1.89 {Log_copies}/mL — AB

## 2017-03-19 MED ORDER — ELVITEG-COBIC-EMTRICIT-TENOFAF 150-150-200-10 MG PO TABS: 1.0000 | ORAL_TABLET | Freq: Every day | ORAL | 4 refills | Status: DC

## 2017-03-19 NOTE — Telephone Encounter (Signed)
[facility] [facility] Application. 

## 2017-03-19 NOTE — Addendum Note (Signed)
Addended by: Lorne Skeens D on: 03/19/2017 11:19 AM   Modules accepted: Orders

## 2017-03-28 ENCOUNTER — Encounter: Payer: Self-pay | Admitting: *Deleted

## 2017-04-05 ENCOUNTER — Encounter: Payer: Self-pay | Admitting: Internal Medicine

## 2017-05-01 ENCOUNTER — Encounter: Payer: No Typology Code available for payment source | Admitting: *Deleted

## 2017-05-04 ENCOUNTER — Encounter (INDEPENDENT_AMBULATORY_CARE_PROVIDER_SITE_OTHER): Payer: Self-pay | Admitting: *Deleted

## 2017-05-04 VITALS — BP 132/87 | HR 81 | Temp 98.3°F | Ht 68.25 in | Wt 264.5 lb

## 2017-05-04 DIAGNOSIS — Z006 Encounter for examination for normal comparison and control in clinical research program: Secondary | ICD-10-CM

## 2017-05-04 NOTE — Progress Notes (Signed)
Angela Crane was here for screening for [phone], The Moxie Trial using vorinostat and tamoxifen. Informed consent was obtained per SOp guidelines. She had already reviewed the consent and understands the risks associated witht the medications. She has been postmenopausal over 2 years at least and we will be checking an [facility] level and pregnancy test on her as part of the study. She currently denies any complaints or new medications over the past 30 days. An ECG was done which showed NSR. I will have her come back Wednesday to see Dr. Tommy Medal for a Complete exam.

## 2017-05-05 LAB — COMPREHENSIVE METABOLIC PANEL
ALBUMIN: 4.2 g/dL (ref 3.6–5.1)
ALT: 13 U/L (ref 6–29)
AST: 20 U/L (ref 10–35)
Alkaline Phosphatase: 82 U/L (ref 33–130)
BUN: 9 mg/dL (ref 7–25)
CHLORIDE: 108 mmol/L (ref 98–110)
CO2: 20 mmol/L (ref 20–31)
CREATININE: 0.82 mg/dL (ref 0.50–1.05)
Calcium: 9.5 mg/dL (ref 8.6–10.4)
GLUCOSE: 101 mg/dL — AB (ref 65–99)
Potassium: 4.1 mmol/L (ref 3.5–5.3)
SODIUM: 140 mmol/L (ref 135–146)
Total Bilirubin: 0.5 mg/dL (ref 0.2–1.2)
Total Protein: 7.7 g/dL (ref 6.1–8.1)

## 2017-05-05 LAB — HEPATITIS C ANTIBODY: HCV Ab: NEGATIVE

## 2017-05-05 LAB — HEPATITIS B SURFACE ANTIGEN: HEP B S AG: NEGATIVE

## 2017-05-05 LAB — HCG, SERUM, QUALITATIVE: Preg, Serum: NEGATIVE

## 2017-05-05 LAB — MAGNESIUM: MAGNESIUM: 2.1 mg/dL (ref 1.5–2.5)

## 2017-05-05 LAB — BILIRUBIN, DIRECT: BILIRUBIN DIRECT: 0.1 mg/dL (ref <=0.2)

## 2017-05-05 LAB — FOLLICLE STIMULATING HORMONE: FSH: 82.9 m[IU]/mL

## 2017-05-05 LAB — PHOSPHORUS: PHOSPHORUS: 4 mg/dL (ref 2.5–4.5)

## 2017-05-09 ENCOUNTER — Encounter (INDEPENDENT_AMBULATORY_CARE_PROVIDER_SITE_OTHER): Payer: Self-pay | Admitting: Infectious Disease

## 2017-05-09 ENCOUNTER — Telehealth: Payer: Self-pay | Admitting: *Deleted

## 2017-05-09 DIAGNOSIS — Z006 Encounter for examination for normal comparison and control in clinical research program: Secondary | ICD-10-CM

## 2017-05-09 DIAGNOSIS — Z1231 Encounter for screening mammogram for malignant neoplasm of breast: Secondary | ICD-10-CM

## 2017-05-09 DIAGNOSIS — B20 Human immunodeficiency virus [HIV] disease: Secondary | ICD-10-CM

## 2017-05-09 LAB — HIV-1 RNA QUANT-NO REFLEX-BLD
HIV 1 RNA QUANT: DETECTED {copies}/mL — AB
HIV-1 RNA Quant, Log: <1.3 Log copies/mL — AB

## 2017-05-09 NOTE — Telephone Encounter (Signed)
Needing annual PAP smear appt.  Appointment made.  Patient completed Mammogram Scholarship Application earlier this morning after [place] visit.  Received verbal order from Dr. Michel Bickers for Screening Mammogram.

## 2017-05-09 NOTE — Progress Notes (Signed)
Chief complaint: exam for ACTG study  Subjective:    Patient ID: Angela Crane, female    DOB: [birth year], 56 y.o.   MRN: [id number]  HPI  Angela Crane is here for CPE for ACTG study.  Lab Results  Component Value Date   HIV1RNAQUANT 78 (H) 03/15/2017   HIV1RNAQUANT 20 10/17/2016   HIV1RNAQUANT <20 10/03/2016   Lab Results  Component Value Date   CD4TABS 2,170 10/03/2016   CD4TABS 1,958 03/21/2016   CD4TABS 1,485 01/05/2016   She is doing well on Genvoya.  Past Medical History:  Diagnosis Date  . HIV (human immunodeficiency virus infection) ([place])   . Hypertension   . Menopausal and perimenopausal disorder     No past surgical history on file.  Family History  Problem Relation Age of Onset  . HIV Daughter   . Diabetes Mother       Social History   Social History  . Marital status: Widowed    Spouse name: N/A  . Number of children: N/A  . Years of education: N/A   Social History Main Topics  . Smoking status: Never Smoker  . Smokeless tobacco: Never Used  . Alcohol use No  . Drug use: No  . Sexual activity: Not Currently     Comment: given condoms, she gives them out   Other Topics Concern  . Not on file   Social History Narrative  . No narrative on file    Allergies  Allergen Reactions  . Sulfonamide Derivatives     REACTION: dangerous inflamation     Current Outpatient Prescriptions:  .  amLODipine (NORVASC) 10 MG tablet, TAKE 1 TABLET BY MOUTH EVERY DAY, Disp: 90 tablet, Rfl: 1 .  citalopram (CELEXA) 20 MG tablet, TAKE 2 TABLETS BY MOUTH EVERY DAY, Disp: 60 tablet, Rfl: 5 .  elvitegravir-cobicistat-emtricitabine-tenofovir (GENVOYA) 150-150-200-10 MG TABS tablet, Take 1 tablet by mouth daily with breakfast., Disp: 30 tablet, Rfl: 4 .  hydrochlorothiazide (HYDRODIURIL) 25 MG tablet, TAKE 1 TABLET BY MOUTH EVERY DAY, Disp: 90 tablet, Rfl: 1 .  loratadine (CLARITIN) 10 MG tablet, Take 10 mg by mouth daily.  , Disp: , Rfl:  .  naproxen sodium  (ANAPROX) 220 MG tablet, Take 220 mg by mouth 2 (two) times daily with a meal., Disp: , Rfl:  .  Pitavastatin Calcium 4 MG TABS, Take 1 tablet (4 mg total) by mouth daily., Disp: 30 tablet, Rfl: 11    Review of Systems  Constitutional: Negative for chills and fever.  HENT: Negative for congestion and sore throat.   Eyes: Negative for photophobia.  Respiratory: Negative for cough, shortness of breath and wheezing.   Cardiovascular: Negative for chest pain, palpitations and leg swelling.  Gastrointestinal: Negative for abdominal pain, blood in stool, constipation, diarrhea, nausea and vomiting.  Genitourinary: Negative for dysuria, flank pain and hematuria.  Musculoskeletal: Negative for back pain and myalgias.  Skin: Negative for rash.  Neurological: Negative for dizziness, weakness and headaches.  Hematological: Does not bruise/bleed easily.  Psychiatric/Behavioral: Negative for suicidal ideas.       Objective:   Physical Exam  Constitutional: She is oriented to person, place, and time. She appears well-developed and well-nourished. No distress.  obese  HENT:  Head: Normocephalic and atraumatic.  Mouth/Throat: No oropharyngeal exudate, posterior oropharyngeal edema, posterior oropharyngeal erythema or tonsillar abscesses.  Eyes: Conjunctivae and EOM are normal. No scleral icterus.  Neck: Normal range of motion. Neck supple.  Cardiovascular: Normal rate, regular rhythm and normal heart sounds.  Exam reveals no gallop and no friction rub.   No murmur heard. Pulmonary/Chest: Effort normal and breath sounds normal. No respiratory distress. She has no wheezes. She has no rales.  Abdominal: Soft. Bowel sounds are normal. She exhibits no distension. There is no hepatosplenomegaly. There is no tenderness. There is no rebound and no CVA tenderness.  Musculoskeletal: She exhibits no edema or tenderness.  Lymphadenopathy:       Head (right side): No submental, no submandibular and no  tonsillar adenopathy present.       Head (left side): No submental, no submandibular and no tonsillar adenopathy present.    She has no cervical adenopathy.       Right cervical: No superficial cervical, no deep cervical and no posterior cervical adenopathy present.      Left cervical: No superficial cervical, no deep cervical and no posterior cervical adenopathy present.  Neurological: She is alert and oriented to person, place, and time. She has normal strength. She displays no atrophy. No cranial nerve deficit or sensory deficit. She exhibits normal muscle tone. She displays no seizure activity. Coordination normal. GCS eye subscore is 4. GCS verbal subscore is 5. GCS motor subscore is 6.  Skin: Skin is warm and dry. No rash noted. She is not diaphoretic. No erythema. No pallor.  Psychiatric: She has a normal mood and affect. Her behavior is normal. Judgment and thought content normal.          Assessment & Plan:    Normal CPE. Apart from obesity. EKG to be reviewed.

## 2017-05-11 ENCOUNTER — Encounter: Payer: Self-pay | Admitting: *Deleted

## 2017-05-14 ENCOUNTER — Other Ambulatory Visit: Payer: Self-pay | Admitting: Internal Medicine

## 2017-05-14 DIAGNOSIS — Z1231 Encounter for screening mammogram for malignant neoplasm of breast: Secondary | ICD-10-CM

## 2017-05-18 ENCOUNTER — Ambulatory Visit (INDEPENDENT_AMBULATORY_CARE_PROVIDER_SITE_OTHER): Payer: Self-pay | Admitting: *Deleted

## 2017-05-18 DIAGNOSIS — Z124 Encounter for screening for malignant neoplasm of cervix: Secondary | ICD-10-CM

## 2017-05-18 DIAGNOSIS — Z23 Encounter for immunization: Secondary | ICD-10-CM

## 2017-05-18 NOTE — Progress Notes (Signed)
Subjective:     Angela Crane is a 56 y.o. woman who comes in today for a  pap smear only. Previous abnormal Pap smears: no. Contraception:condoms.  Objective:    There were no vitals taken for this visit. Pelvic Exam:  Pap smear obtained.   Assessment:    Screening pap smear.   Plan:    Follow up in year, or as indicated by Pap results.  Given condoms.

## 2017-05-18 NOTE — Patient Instructions (Signed)
Patient needs to look in MyChart next week for results.  Keep moving right arm today to keep from getting stiff and sore.

## 2017-05-21 LAB — CYTOLOGY - PAP: Diagnosis: NEGATIVE

## 2017-05-21 LAB — CERVICOVAGINAL ANCILLARY ONLY
Chlamydia: NEGATIVE
Neisseria Gonorrhea: NEGATIVE

## 2017-05-22 ENCOUNTER — Encounter: Payer: Self-pay | Admitting: *Deleted

## 2017-05-28 NOTE — Addendum Note (Signed)
Addended by: Reggy Eye on: 05/28/2017 09:24 AM   Modules accepted: Level of Service

## 2017-05-30 ENCOUNTER — Ambulatory Visit
Admission: RE | Admit: 2017-05-30 | Discharge: 2017-05-30 | Disposition: A | Discharge status: Home / Self Care | Payer: No Typology Code available for payment source | Source: Ambulatory Visit | Attending: Internal Medicine | Admitting: Internal Medicine

## 2017-05-30 DIAGNOSIS — Z1231 Encounter for screening mammogram for malignant neoplasm of breast: Secondary | ICD-10-CM

## 2017-06-05 ENCOUNTER — Other Ambulatory Visit: Payer: Self-pay | Admitting: Internal Medicine

## 2017-06-05 DIAGNOSIS — I10 Essential (primary) hypertension: Secondary | ICD-10-CM

## 2017-06-07 ENCOUNTER — Encounter (INDEPENDENT_AMBULATORY_CARE_PROVIDER_SITE_OTHER): Payer: Self-pay | Admitting: *Deleted

## 2017-06-07 VITALS — BP 107/77 | HR 89 | Temp 98.2°F | Wt 267.2 lb

## 2017-06-07 DIAGNOSIS — Z006 Encounter for examination for normal comparison and control in clinical research program: Secondary | ICD-10-CM

## 2017-06-07 LAB — COMPREHENSIVE METABOLIC PANEL WITH GFR
ALT: 12 U/L (ref 6–29)
AST: 19 U/L (ref 10–35)
Albumin: 4.5 g/dL (ref 3.6–5.1)
Alkaline Phosphatase: 85 U/L (ref 33–130)
BUN: 12 mg/dL (ref 7–25)
CO2: 28 mmol/L (ref 20–31)
Calcium: 9.6 mg/dL (ref 8.6–10.4)
Chloride: 101 mmol/L (ref 98–110)
Creat: 0.87 mg/dL (ref 0.50–1.05)
Glucose, Bld: 98 mg/dL (ref 65–99)
Potassium: 3.8 mmol/L (ref 3.5–5.3)
Sodium: 140 mmol/L (ref 135–146)
Total Bilirubin: 0.6 mg/dL (ref 0.2–1.2)
Total Protein: 8 g/dL (ref 6.1–8.1)

## 2017-06-07 LAB — BILIRUBIN,DIRECT & INDIRECT (FRACTIONATED)
Bilirubin, Direct: 0.1 mg/dL (ref <=0.2)
Indirect Bilirubin: 0.5 mg/dL (ref 0.2–1.2)

## 2017-06-07 LAB — CBC WITH DIFFERENTIAL/PLATELET
BASOS ABS: 0 {cells}/uL (ref 0–200)
Basophils Relative: 0 %
EOS PCT: 2 %
Eosinophils Absolute: 148 cells/uL (ref 15–500)
HCT: 41.8 % (ref 35.0–45.0)
Hemoglobin: 14 g/dL (ref 11.7–15.5)
LYMPHS ABS: 3774 {cells}/uL (ref 850–3900)
Lymphocytes Relative: 51 %
MCH: 27.9 pg (ref 27.0–33.0)
MCHC: 33.5 g/dL (ref 32.0–36.0)
MCV: 83.3 fL (ref 80.0–100.0)
MONOS PCT: 5 %
MPV: 9.7 fL (ref 7.5–12.5)
Monocytes Absolute: 370 cells/uL (ref 200–950)
Neutro Abs: 3108 cells/uL (ref 1500–7800)
Neutrophils Relative %: 42 %
Platelets: 306 10*3/uL (ref 140–400)
RBC: 5.02 MIL/uL (ref 3.80–5.10)
RDW: 16 % — ABNORMAL HIGH (ref 11.0–15.0)
WBC: 7.4 10*3/uL (ref 3.8–10.8)

## 2017-06-07 LAB — MAGNESIUM: Magnesium: 2.2 mg/dL (ref 1.5–2.5)

## 2017-06-07 LAB — PHOSPHORUS: Phosphorus: 4.1 mg/dL (ref 2.5–4.5)

## 2017-06-07 LAB — BILIRUBIN, FRACTIONATED(TOT/DIR/INDIR)
Bilirubin, Direct: 0.1 mg/dL (ref <=0.2)
Indirect Bilirubin: 0.5 mg/dL (ref 0.2–1.2)
Total Bilirubin: 0.6 mg/dL (ref 0.2–1.2)

## 2017-06-07 NOTE — Progress Notes (Signed)
Angela Crane is here for the preentry visit for [phone]. She denies any new problems or concerns and is eager to get started on study. Entry is planned for July 16th.

## 2017-06-25 ENCOUNTER — Encounter (INDEPENDENT_AMBULATORY_CARE_PROVIDER_SITE_OTHER): Payer: Self-pay | Admitting: *Deleted

## 2017-06-25 VITALS — BP 147/96 | HR 77 | Temp 97.9°F | Wt 266.8 lb

## 2017-06-25 DIAGNOSIS — Z006 Encounter for examination for normal comparison and control in clinical research program: Secondary | ICD-10-CM

## 2017-06-25 LAB — HIV-1 RNA QUANT-NO REFLEX-BLD: HIV-1 RNA Viral Load: <20

## 2017-06-25 NOTE — Progress Notes (Signed)
Angela Crane is here today to enroll in [phone], the Moxie study. After eligibility was verified she was randomized to receive tamoxifen for 39 doses daily. We reviewed dosing requirements, side effects to watch out for and adherence to study treatment. She denied any new complaints or new medications. She is to return on Aug 13th for the next study visit. She was instructed to call for any adverse events or missed doses of the tamoxifen.

## 2017-06-28 ENCOUNTER — Other Ambulatory Visit: Payer: Self-pay | Admitting: *Deleted

## 2017-06-28 DIAGNOSIS — Z006 Encounter for examination for normal comparison and control in clinical research program: Secondary | ICD-10-CM

## 2017-06-28 MED ORDER — STUDY - INVESTIGATIONAL MEDICATION: 99 refills | Status: DC

## 2017-07-11 ENCOUNTER — Encounter: Payer: Self-pay | Admitting: *Deleted

## 2017-07-23 ENCOUNTER — Encounter (INDEPENDENT_AMBULATORY_CARE_PROVIDER_SITE_OTHER): Payer: Self-pay | Admitting: *Deleted

## 2017-07-23 VITALS — BP 138/95 | HR 85 | Temp 98.5°F | Wt 268.0 lb

## 2017-07-23 DIAGNOSIS — Z006 Encounter for examination for normal comparison and control in clinical research program: Secondary | ICD-10-CM

## 2017-07-23 LAB — BILIRUBIN,DIRECT & INDIRECT (FRACTIONATED)
BILIRUBIN INDIRECT: 0.2 mg/dL (ref 0.2–1.2)
Bilirubin, Direct: 0 mg/dL (ref <=0.2)

## 2017-07-23 LAB — HIV-1 RNA QUANT-NO REFLEX-BLD: HIV-1 RNA Viral Load: <20

## 2017-07-23 LAB — COMPREHENSIVE METABOLIC PANEL
ALK PHOS: 71 U/L (ref 33–130)
ALT: 11 U/L (ref 6–29)
AST: 17 U/L (ref 10–35)
Albumin: 4.1 g/dL (ref 3.6–5.1)
BILIRUBIN TOTAL: 0.2 mg/dL (ref 0.2–1.2)
BUN: 11 mg/dL (ref 7–25)
CO2: 25 mmol/L (ref 20–32)
Calcium: 9.4 mg/dL (ref 8.6–10.4)
Chloride: 105 mmol/L (ref 98–110)
Creat: 0.82 mg/dL (ref 0.50–1.05)
GLUCOSE: 98 mg/dL (ref 65–99)
Potassium: 4.7 mmol/L (ref 3.5–5.3)
Sodium: 140 mmol/L (ref 135–146)
Total Protein: 7.5 g/dL (ref 6.1–8.1)

## 2017-07-23 LAB — PHOSPHORUS: Phosphorus: 3.8 mg/dL (ref 2.5–4.5)

## 2017-07-23 LAB — CD4/CD8 (T-HELPER/T-SUPPRESSOR CELL)
CD4 Count: 2063
CD4 T CELL HELPER: 54.3
CD8 % Suppressor T Cell: 21.4
CD8 T Cell Abs: 813

## 2017-07-23 LAB — MAGNESIUM: Magnesium: 2.2 mg/dL (ref 1.5–2.5)

## 2017-07-23 NOTE — Progress Notes (Signed)
Angela Crane was here for day 28 for the [place] study. She denies any side effects from the tamoxifen or any other new problems. She has been taking the tamoxifen at 8am every morning with her ARVs. She will be returning next Monday for day 35 when she will receive her first dose of vorinostat.

## 2017-07-27 ENCOUNTER — Encounter: Payer: Self-pay | Admitting: Licensed Clinical Social Worker

## 2017-07-30 ENCOUNTER — Encounter (INDEPENDENT_AMBULATORY_CARE_PROVIDER_SITE_OTHER): Payer: Self-pay | Admitting: *Deleted

## 2017-07-30 VITALS — BP 131/81 | HR 81 | Temp 98.3°F | Wt 264.5 lb

## 2017-07-30 DIAGNOSIS — Z006 Encounter for examination for normal comparison and control in clinical research program: Secondary | ICD-10-CM

## 2017-07-30 NOTE — Progress Notes (Signed)
   Subjective:    Patient ID: Angela Crane, female    DOB: 13-Jul-1961, 56 y.o.   MRN: [id number]  HPI    Review of Systems  Constitutional: Negative.   HENT: Negative.   Eyes: Negative.   Respiratory: Negative.   Cardiovascular: Negative.   Gastrointestinal: Negative.   Musculoskeletal: Negative.   Skin: Negative.   Neurological: Negative.   Psychiatric/Behavioral: Negative.        Objective:   Physical Exam  Constitutional: She is oriented to person, place, and time. She appears well-developed.  HENT:  Mouth/Throat: Oropharynx is clear and moist.  Eyes: No scleral icterus.  Neck: Normal range of motion.  Cardiovascular: Normal rate, regular rhythm, normal heart sounds and intact distal pulses.   Pulmonary/Chest: Effort normal and breath sounds normal.  Abdominal: Soft. Bowel sounds are normal.  Musculoskeletal: She exhibits no edema.  Lymphadenopathy:    She has no cervical adenopathy.  Neurological: She is alert and oriented to person, place, and time.  Skin: Skin is warm and dry.  Psychiatric: She has a normal mood and affect.          Assessment & Plan:  Angela Crane was here for her day 43 visit for the [place] study. She has been tolerating the tamoxifen without any side effects. She denies any other problems. We drew labs prior to the 1st dose of Vorinostat which she took at 9:20am. Labs were drawn at 09:10 am. She was given some crackers and water to take with the medication. She was instructed to drink at least 1 liter of water daily since the Vorinostat can be dehydrating. She is to return on Thursday at [place].

## 2017-08-02 ENCOUNTER — Encounter (INDEPENDENT_AMBULATORY_CARE_PROVIDER_SITE_OTHER): Payer: Self-pay | Admitting: *Deleted

## 2017-08-02 VITALS — BP 134/81 | HR 82 | Temp 98.2°F | Resp 16 | Wt 267.0 lb

## 2017-08-02 DIAGNOSIS — Z006 Encounter for examination for normal comparison and control in clinical research program: Secondary | ICD-10-CM

## 2017-08-02 NOTE — Progress Notes (Signed)
Angela Crane is here for her day 76 visit for A5366, the Moxie Study and Reprieve month 28. She says that she has been very thristy since she took the first dose of vorinostat on Monday and has had a bitter taste in her mouth that went away yesterday. We drew PK labs at 08:14 and and she then took her second dose of Vorinostat at 08:16. She will be returning for repeat lab draws in 5 hours for post PK draw. She says she has been very adherent with all her meds including the Pitavastatin and tamoxifen. She denies any muscle aches or weakness. [name] returned this afternoon and we were able to draw the 5 hr PK at 1323. She denies any new problems or concerns. She will be returning next Thursday for day 45. She was instructed to keep drinking a lot of water for hydration.

## 2017-08-06 ENCOUNTER — Encounter: Payer: Self-pay | Admitting: Infectious Disease

## 2017-08-09 ENCOUNTER — Other Ambulatory Visit: Payer: Self-pay | Admitting: Internal Medicine

## 2017-08-09 ENCOUNTER — Encounter (INDEPENDENT_AMBULATORY_CARE_PROVIDER_SITE_OTHER): Payer: Self-pay | Admitting: *Deleted

## 2017-08-09 ENCOUNTER — Encounter: Payer: Self-pay | Admitting: *Deleted

## 2017-08-09 VITALS — BP 140/87 | HR 94 | Temp 98.2°F | Wt 268.5 lb

## 2017-08-09 DIAGNOSIS — I10 Essential (primary) hypertension: Secondary | ICD-10-CM

## 2017-08-09 DIAGNOSIS — Z006 Encounter for examination for normal comparison and control in clinical research program: Secondary | ICD-10-CM

## 2017-08-09 LAB — BILIRUBIN, FRACTIONATED(TOT/DIR/INDIR)
BILIRUBIN INDIRECT: 0.2 mg/dL (ref 0.2–1.2)
Bilirubin, Direct: 0.1 mg/dL (ref <=0.2)
Total Bilirubin: 0.3 mg/dL (ref 0.2–1.2)

## 2017-08-09 LAB — BILIRUBIN,DIRECT & INDIRECT (FRACTIONATED)
Bilirubin, Direct: 0.1 mg/dL (ref <=0.2)
Indirect Bilirubin: 0.2 mg/dL (ref 0.2–1.2)

## 2017-08-09 LAB — CD4/CD8 (T-HELPER/T-SUPPRESSOR CELL)
CD4 % Helper T Cell: 52.5
CD4 Count: 2100
CD8 T CELL ABS: 876
CD8 T CELL SUPPRESSOR: 21.9

## 2017-08-09 LAB — COMPREHENSIVE METABOLIC PANEL
ALK PHOS: 65 U/L (ref 33–130)
ALT: 12 U/L (ref 6–29)
AST: 21 U/L (ref 10–35)
Albumin: 4.1 g/dL (ref 3.6–5.1)
BILIRUBIN TOTAL: 0.3 mg/dL (ref 0.2–1.2)
BUN: 12 mg/dL (ref 7–25)
CO2: 24 mmol/L (ref 20–32)
CREATININE: 0.92 mg/dL (ref 0.50–1.05)
Calcium: 8.8 mg/dL (ref 8.6–10.4)
Chloride: 109 mmol/L (ref 98–110)
GLUCOSE: 86 mg/dL (ref 65–99)
Potassium: 4.3 mmol/L (ref 3.5–5.3)
Sodium: 143 mmol/L (ref 135–146)
TOTAL PROTEIN: 7.1 g/dL (ref 6.1–8.1)

## 2017-08-09 LAB — MAGNESIUM: Magnesium: 2.1 mg/dL (ref 1.5–2.5)

## 2017-08-09 LAB — PHOSPHORUS: PHOSPHORUS: 3.5 mg/dL (ref 2.5–4.5)

## 2017-08-09 NOTE — Progress Notes (Signed)
Angela Crane is here for her week 7 visit for A5366, the Moxie Study. She denies any new problems or concerns. She says her increased thirst stopped about 2 days after the last dose of Vorinostat. She will be returning on 9/20 for the final visit for the study.

## 2017-08-30 ENCOUNTER — Ambulatory Visit (INDEPENDENT_AMBULATORY_CARE_PROVIDER_SITE_OTHER): Payer: Self-pay | Admitting: Internal Medicine

## 2017-08-30 ENCOUNTER — Encounter (INDEPENDENT_AMBULATORY_CARE_PROVIDER_SITE_OTHER): Payer: Self-pay | Admitting: *Deleted

## 2017-08-30 ENCOUNTER — Encounter: Payer: Self-pay | Admitting: Internal Medicine

## 2017-08-30 VITALS — BP 142/88 | HR 106 | Temp 98.2°F | Ht 68.0 in | Wt 270.5 lb

## 2017-08-30 DIAGNOSIS — Z23 Encounter for immunization: Secondary | ICD-10-CM

## 2017-08-30 DIAGNOSIS — B20 Human immunodeficiency virus [HIV] disease: Secondary | ICD-10-CM

## 2017-08-30 DIAGNOSIS — Z006 Encounter for examination for normal comparison and control in clinical research program: Secondary | ICD-10-CM

## 2017-08-30 NOTE — Progress Notes (Signed)
Patient Active Problem List   Diagnosis Date Noted  . Human immunodeficiency virus (HIV) disease ([place]) 05/20/2007    Priority: High  . Vaginal pruritus 09/04/2013  . Perimenopausal atrophic vaginitis 09/04/2013  . Menopausal and perimenopausal disorder   . Obesity 11/08/2011  . Dyslipidemia 05/11/2011  . ESSENTIAL HYPERTENSION 04/27/2010  . ABSCESS, TOOTH 10/09/2008  . COUGH 04/22/2008  . DISORDER, DEPRESSIVE NEC 05/20/2007    Patient's Medications  New Prescriptions   No medications on file  Previous Medications   AMLODIPINE (NORVASC) 10 MG TABLET    TAKE 1 TABLET BY MOUTH EVERY DAY   CITALOPRAM (CELEXA) 20 MG TABLET    TAKE 2 TABLETS BY MOUTH EVERY DAY   ELVITEGRAVIR-COBICISTAT-EMTRICITABINE-TENOFOVIR (GENVOYA) 150-150-200-10 MG TABS TABLET    Take 1 tablet by mouth daily with breakfast.   HYDROCHLOROTHIAZIDE (HYDRODIURIL) 25 MG TABLET    TAKE 1 TABLET BY MOUTH EVERY DAY   INVESTIGATIONAL - STUDY MEDICATION    Study name: The Moxie Trial Additional study details: She will be taking tamoxifen 20 mg po daily for 38 days.  This is study provided. Please do not fill prescription.   LORATADINE (CLARITIN) 10 MG TABLET    Take 10 mg by mouth daily.     NAPROXEN SODIUM (ANAPROX) 220 MG TABLET    Take 220 mg by mouth 2 (two) times daily with a meal.   PITAVASTATIN CALCIUM 4 MG TABS    Take 1 tablet (4 mg total) by mouth daily.  Modified Medications   No medications on file  Discontinued Medications   No medications on file    Subjective: Angela Crane is in for her routine HIV follow-up visit. She has had no problems obtaining, taking or tolerating her Genvoya. She takes it each morning and does not recall me seeing doses. She is feeling well.   Review of Systems: Review of Systems  Constitutional: Negative for chills, diaphoresis, fever, malaise/fatigue and weight loss.  HENT: Negative for sore throat.   Respiratory: Negative for cough, sputum production and shortness of  breath.   Cardiovascular: Negative for chest pain.  Gastrointestinal: Negative for abdominal pain, diarrhea, heartburn, nausea and vomiting.  Genitourinary: Negative for dysuria and frequency.  Musculoskeletal: Negative for joint pain and myalgias.  Skin: Negative for rash.  Neurological: Negative for dizziness and headaches.  Psychiatric/Behavioral: Negative for depression and substance abuse. The patient is not nervous/anxious.     Past Medical History:  Diagnosis Date  . HIV (human immunodeficiency virus infection) ([place])   . Hypertension   . Menopausal and perimenopausal disorder     Social History  Substance Use Topics  . Smoking status: Never Smoker  . Smokeless tobacco: Never Used  . Alcohol use No    Family History  Problem Relation Age of Onset  . HIV Daughter   . Diabetes Mother     Allergies  Allergen Reactions  . Sulfonamide Derivatives     REACTION: dangerous inflamation    Objective:  Vitals:   08/30/17 0835  Weight: 267 lb (121.1 kg)   Body mass index is 40.3 kg/m.  Physical Exam  Constitutional: She is oriented to person, place, and time.  HENT:  Mouth/Throat: No oropharyngeal exudate.  Eyes: Conjunctivae are normal.  Cardiovascular: Normal rate and regular rhythm.   No murmur heard. Pulmonary/Chest: Breath sounds normal.  Abdominal: Soft. She exhibits no mass. There is no tenderness.  Musculoskeletal: Normal range of motion.  Neurological: She is alert and  oriented to person, place, and time.  Skin: No rash noted.  Psychiatric: Mood and affect normal.    Lab Results Lab Results  Component Value Date   WBC 7.4 06/07/2017   HGB 14.0 06/07/2017   HCT 41.8 06/07/2017   MCV 83.3 06/07/2017   PLT 306 06/07/2017    Lab Results  Component Value Date   CREATININE 0.92 08/09/2017   BUN 12 08/09/2017   NA 143 08/09/2017   K 4.3 08/09/2017   CL 109 08/09/2017   CO2 24 08/09/2017    Lab Results  Component Value Date   ALT 12  08/09/2017   AST 21 08/09/2017   ALKPHOS 65 08/09/2017   BILITOT 0.3 08/09/2017   BILITOT 0.3 08/09/2017    Lab Results  Component Value Date   CHOL 197 03/04/2015   HDL 43 (L) 03/04/2015   LDLCALC 123 (H) 03/04/2015   TRIG 156 (H) 03/04/2015   CHOLHDL 4.6 03/04/2015   Lab Results  Component Value Date   LABRPR NON REAC 12/30/2013   HIV 1 RNA Quant (copies/mL)  Date Value  05/04/2017 <20 DETECTED (A)  03/15/2017 78 (H)  10/17/2016 20   HIV-1 RNA Viral Load (no units)  Date Value  06/25/2017 <20  03/21/2016 41  01/05/2016 <40   CD4 (no units)  Date Value  03/21/2016 1,958  01/05/2016 1,485  10/13/2015 1,147   CD4 T Cell Abs (/uL)  Date Value  10/03/2016 2,170     Problem List Items Addressed This Visit      High   Human immunodeficiency virus (HIV) disease ([place])    Her infection remains under excellent, long-term control. She will continue Genvoya in follow-up after lab work in one year.      Relevant Orders   CBC   T-helper cell (CD4)- ([facility] clinic only)   Comprehensive metabolic panel   Lipid panel   RPR   HIV 1 RNA quant-no reflex-bld        Michel Bickers, MD [facility] for [facility] [place] 336 [phone] pager   [phone] cell 08/30/2017, 8:40 AM

## 2017-08-30 NOTE — Progress Notes (Signed)
Angela Crane is here for day 86 of the [place]. This is the last visit for it. She denies any new problems or concerns. I gave her a flu shot after we drew labs for study.

## 2017-08-30 NOTE — Assessment & Plan Note (Signed)
Her infection remains under excellent, long-term control. She will continue Genvoya in follow-up after lab work in one year.

## 2017-09-06 ENCOUNTER — Encounter: Payer: Self-pay | Admitting: *Deleted

## 2017-09-08 ENCOUNTER — Other Ambulatory Visit: Payer: Self-pay | Admitting: Internal Medicine

## 2017-09-08 DIAGNOSIS — B20 Human immunodeficiency virus [HIV] disease: Secondary | ICD-10-CM

## 2017-10-09 ENCOUNTER — Other Ambulatory Visit: Payer: Self-pay | Admitting: Internal Medicine

## 2017-10-09 DIAGNOSIS — F329 Major depressive disorder, single episode, unspecified: Secondary | ICD-10-CM

## 2017-10-09 DIAGNOSIS — F32A Depression, unspecified: Secondary | ICD-10-CM

## 2017-11-12 ENCOUNTER — Encounter: Payer: Self-pay | Admitting: Licensed Clinical Social Worker

## 2017-11-12 ENCOUNTER — Encounter (INDEPENDENT_AMBULATORY_CARE_PROVIDER_SITE_OTHER): Payer: Self-pay | Admitting: Licensed Clinical Social Worker

## 2017-11-12 VITALS — BP 147/91 | HR 97 | Temp 98.3°F | Wt 274.0 lb

## 2017-11-12 DIAGNOSIS — Z006 Encounter for examination for normal comparison and control in clinical research program: Secondary | ICD-10-CM

## 2017-11-12 NOTE — Progress Notes (Signed)
Angela Crane is here today for week 60 of the Reprieve study visit. She admits to taking her medication everyday, and denies muscles aches or pains. She will return in March 2019. She received a 6 month supply of study medication today.

## 2018-01-22 ENCOUNTER — Encounter: Payer: Self-pay | Admitting: Internal Medicine

## 2018-02-11 ENCOUNTER — Other Ambulatory Visit: Payer: Self-pay | Admitting: Internal Medicine

## 2018-02-11 DIAGNOSIS — I10 Essential (primary) hypertension: Secondary | ICD-10-CM

## 2018-02-26 ENCOUNTER — Encounter (INDEPENDENT_AMBULATORY_CARE_PROVIDER_SITE_OTHER): Payer: Self-pay | Admitting: *Deleted

## 2018-02-26 VITALS — BP 135/83 | HR 99 | Temp 98.3°F | Wt 270.0 lb

## 2018-02-26 DIAGNOSIS — Z006 Encounter for examination for normal comparison and control in clinical research program: Secondary | ICD-10-CM

## 2018-02-26 NOTE — Progress Notes (Signed)
Angela Crane is here for her month 49 visit fro Reprieve. She denies any new problems, except that she is now unemployed and looking for work. She says her adherence is excellent and she is not having any muscleaches or weakness. She will be returning in July for the next study visit.

## 2018-04-10 ENCOUNTER — Other Ambulatory Visit: Payer: Self-pay | Admitting: Internal Medicine

## 2018-04-10 DIAGNOSIS — F329 Major depressive disorder, single episode, unspecified: Secondary | ICD-10-CM

## 2018-04-10 DIAGNOSIS — F32A Depression, unspecified: Secondary | ICD-10-CM

## 2018-04-16 ENCOUNTER — Telehealth: Payer: Self-pay | Admitting: *Deleted

## 2018-04-16 NOTE — Telephone Encounter (Signed)
After review of the form Dr Megan Salon says she will need a visit to discuss the forms and how she would like them filled out. Called her and was unable to leave a message as the voicemail was not set up will wait for her to call back or stop by the office and let her know.

## 2018-04-16 NOTE — Telephone Encounter (Signed)
Paperwork left at front desk this morning and patient states she needs by tomorrow. Tried to call her to let her know that is not possible and we will call her once it is done. This usually takes 7-10 business days. If she returns the call please let her know information.

## 2018-04-18 ENCOUNTER — Ambulatory Visit (INDEPENDENT_AMBULATORY_CARE_PROVIDER_SITE_OTHER): Payer: Self-pay | Admitting: Internal Medicine

## 2018-04-18 DIAGNOSIS — M79606 Pain in leg, unspecified: Secondary | ICD-10-CM | POA: Insufficient documentation

## 2018-04-18 NOTE — Assessment & Plan Note (Signed)
I suggested that she try knee-high compression stockings put on first thing in the morning as well as extra strength Tylenol before she starts work and midway through her shift.  I told her that this is not an illness or work-related injury that would allow me to put her out of work.  I encouraged her to continue looking for other, more suitable employment.  She will follow-up in 4 to 6 weeks.

## 2018-04-18 NOTE — Progress Notes (Signed)
Patient Active Problem List   Diagnosis Date Noted  . Human immunodeficiency virus (HIV) disease ([place]) 05/20/2007    Priority: High  . Leg pain 04/18/2018  . Vaginal pruritus 09/04/2013  . Perimenopausal atrophic vaginitis 09/04/2013  . Menopausal and perimenopausal disorder   . Obesity 11/08/2011  . Dyslipidemia 05/11/2011  . ESSENTIAL HYPERTENSION 04/27/2010  . ABSCESS, TOOTH 10/09/2008  . COUGH 04/22/2008  . DISORDER, DEPRESSIVE NEC 05/20/2007    Patient's Medications  New Prescriptions   No medications on file  Previous Medications   AMLODIPINE (NORVASC) 10 MG TABLET    TAKE 1 TABLET BY MOUTH EVERY DAY   CITALOPRAM (CELEXA) 20 MG TABLET    TAKE 2 TABLETS BY MOUTH EVERY DAY   GENVOYA 150-150-200-10 MG TABS TABLET    TAKE 1 TABLET BY MOUTH DAILY WITH BREAKFAST   HYDROCHLOROTHIAZIDE (HYDRODIURIL) 25 MG TABLET    TAKE 1 TABLET BY MOUTH EVERY DAY   INVESTIGATIONAL - STUDY MEDICATION    Study name: The [place] Trial Additional study details: She will be taking tamoxifen 20 mg po daily for 38 days.  This is study provided. Please do not fill prescription.   LORATADINE (CLARITIN) 10 MG TABLET    Take 10 mg by mouth daily.     NAPROXEN SODIUM (ANAPROX) 220 MG TABLET    Take 220 mg by mouth 2 (two) times daily with a meal.   PITAVASTATIN CALCIUM 4 MG TABS    Take 1 tablet (4 mg total) by mouth daily.  Modified Medications   No medications on file  Discontinued Medications   No medications on file    Subjective: Angela Crane is in for a work in visit.  She has had no problems obtaining, taking or tolerating her Genvoya and never misses doses.  She is in today because she has been having some pain and swelling in her left foot.  She also has some pain in her right foot.  She is currently working on the [employer] for [employer].  She works 12-hour shifts and has to stand the whole time with the exception of her 30-minute breaks.  She notes that she has  pain and swelling in her feet after standing for long periods of time.  She is looking for a new job.  She wants to know if she can have time out of work.  Review of Systems: Review of Systems  Constitutional: Negative for chills, diaphoresis, fever and weight loss.  Musculoskeletal: Positive for joint pain.    Past Medical History:  Diagnosis Date  . HIV (human immunodeficiency virus infection) ([place])   . Hypertension   . Menopausal and perimenopausal disorder     Social History   Tobacco Use  . Smoking status: Never Smoker  . Smokeless tobacco: Never Used  Substance Use Topics  . Alcohol use: No  . Drug use: No    Family History  Problem Relation Age of Onset  . HIV Daughter   . Diabetes Mother     Allergies  Allergen Reactions  . Sulfonamide Derivatives     REACTION: dangerous inflamation    Health Maintenance  Topic Date Due  . COLONOSCOPY  10/26/2010  . PAP SMEAR  05/18/2018  . INFLUENZA VACCINE  07/11/2018  . MAMMOGRAM  05/31/2019  . TETANUS/TDAP  05/19/2027  . Hepatitis C Screening  Completed  . HIV Screening  Completed    Objective:  Vitals:   04/18/18  0850  BP: (!) 144/89  Pulse: 85  Temp: 98.2 F (36.8 C)  SpO2: 94%   There is no height or weight on file to calculate BMI.  Physical Exam  Constitutional: She is oriented to person, place, and time.  She is pleasant and in no distress.  Musculoskeletal: She exhibits no edema or tenderness.  She has no swelling or erythema of her legs and feet.  She has normal range of motion without pain.  Neurological: She is alert and oriented to person, place, and time.  Skin: No rash noted.  Psychiatric: She has a normal mood and affect.    Lab Results Lab Results  Component Value Date   WBC 7.4 06/07/2017   HGB 14.0 06/07/2017   HCT 41.8 06/07/2017   MCV 83.3 06/07/2017   PLT 306 06/07/2017    Lab Results  Component Value Date   CREATININE 0.92 08/09/2017   BUN 12 08/09/2017   NA 143  08/09/2017   K 4.3 08/09/2017   CL 109 08/09/2017   CO2 24 08/09/2017    Lab Results  Component Value Date   ALT 12 08/09/2017   AST 21 08/09/2017   ALKPHOS 65 08/09/2017   BILITOT 0.3 08/09/2017   BILITOT 0.3 08/09/2017    Lab Results  Component Value Date   CHOL 197 03/04/2015   HDL 43 (L) 03/04/2015   LDLCALC 123 (H) 03/04/2015   TRIG 156 (H) 03/04/2015   CHOLHDL 4.6 03/04/2015   Lab Results  Component Value Date   LABRPR NON REAC 12/30/2013   HIV 1 RNA Quant (copies/mL)  Date Value  05/04/2017 <20 DETECTED (A)  03/15/2017 78 (H)  10/17/2016 20   HIV-1 RNA Viral Load (no units)  Date Value  07/23/2017 <20  06/25/2017 <20  03/21/2016 41   CD4 (no units)  Date Value  03/21/2016 1,958  01/05/2016 1,485  10/13/2015 1,147   CD4 T Cell Abs (/uL)  Date Value  10/03/2016 2,170     Problem List Items Addressed This Visit      Unprioritized   Leg pain    I suggested that she try knee-high compression stockings put on first thing in the morning as well as extra strength Tylenol before she starts work and midway through her shift.  I told her that this is not an illness or work-related injury that would allow me to put her out of work.  I encouraged her to continue looking for other, more suitable employment.  She will follow-up in 4 to 6 weeks.           Michel Bickers, MD [facility] [place] [facility] [phone] pager   [phone] cell 04/18/2018, 8:51 AM

## 2018-04-29 ENCOUNTER — Ambulatory Visit (INDEPENDENT_AMBULATORY_CARE_PROVIDER_SITE_OTHER): Payer: Self-pay | Admitting: Family

## 2018-04-29 ENCOUNTER — Telehealth: Payer: Self-pay | Admitting: Behavioral Health

## 2018-04-29 ENCOUNTER — Encounter: Payer: Self-pay | Admitting: Family

## 2018-04-29 VITALS — BP 132/87 | HR 85 | Temp 98.4°F | Ht 68.0 in | Wt 269.8 lb

## 2018-04-29 DIAGNOSIS — B20 Human immunodeficiency virus [HIV] disease: Secondary | ICD-10-CM

## 2018-04-29 DIAGNOSIS — R05 Cough: Secondary | ICD-10-CM

## 2018-04-29 DIAGNOSIS — R059 Cough, unspecified: Secondary | ICD-10-CM

## 2018-04-29 LAB — CBC WITH DIFFERENTIAL/PLATELET
BASOS PCT: 0.6 %
Basophils Absolute: 40 cells/uL (ref 0–200)
Eosinophils Absolute: 188 cells/uL (ref 15–500)
Eosinophils Relative: 2.8 %
HEMATOCRIT: 41.6 % (ref 35.0–45.0)
HEMOGLOBIN: 14.2 g/dL (ref 11.7–15.5)
LYMPHS ABS: 3980 {cells}/uL — AB (ref 850–3900)
MCH: 28 pg (ref 27.0–33.0)
MCHC: 34.1 g/dL (ref 32.0–36.0)
MCV: 81.9 fL (ref 80.0–100.0)
MPV: 10.4 fL (ref 7.5–12.5)
Monocytes Relative: 5.8 %
NEUTROS ABS: 2104 {cells}/uL (ref 1500–7800)
Neutrophils Relative %: 31.4 %
Platelets: 315 10*3/uL (ref 140–400)
RBC: 5.08 10*6/uL (ref 3.80–5.10)
RDW: 14.5 % (ref 11.0–15.0)
Total Lymphocyte: 59.4 %
WBC: 6.7 10*3/uL (ref 3.8–10.8)
WBCMIX: 389 {cells}/uL (ref 200–950)

## 2018-04-29 MED ORDER — PREDNISONE 10 MG PO TABS: 10.0000 mg | ORAL_TABLET | Freq: Two times a day (BID) | ORAL | 0 refills | Status: DC

## 2018-04-29 NOTE — Assessment & Plan Note (Signed)
Symptoms and exam appear consistent with acute upper respiratory infection most likely viral. Could be early bronchitis. Recommend over the counter medications as needed for symptom relief and supportive care. Will try low dose prednisone to help with the cough. Follow up if symptoms worsen or do not improve.

## 2018-04-29 NOTE — Telephone Encounter (Signed)
Patient called stating she has been sick for one week.  Patient states she has been having n/v, coughing, and loss of appetite.  Patient states she has not been getting better but states she feels worse.  Patient states she has tried Mucinex but has not tried anything else.  Kashina does not have a promary care doctor at this time.  Scheduled her a sick visit appointment with Terri Piedra NP today 04/29/2018 at 11:30. Pricilla Riffle RN greg

## 2018-04-29 NOTE — Patient Instructions (Addendum)
Nice to see you!  Please continue to take your medications as prescribed.  We will check your blood work today.   Start taking the prednisone for the cough and continue with the Mucinex DM.   If your symptoms worsen or do not improve, please let us know.    General Recommendations:    Please drink plenty of fluids.  Get plenty of rest   Sleep in humidified air  Use saline nasal sprays  Netti pot   OTC Medications:  Decongestants - helps relieve congestion   Flonase (generic fluticasone) or Nasacort (generic triamcinolone) - please make sure to use the "cross-over" technique at a 45 degree angle towards the opposite eye as opposed to straight up the nasal passageway.   Sudafed (generic pseudoephedrine - Note this is the one that is available behind the pharmacy counter); Products with phenylephrine (-PE) may also be used but is often not as effective as pseudoephedrine.   If you have HIGH BLOOD PRESSURE - Coricidin HBP; AVOID any product that is -D as this contains pseudoephedrine which may increase your blood pressure.  Afrin (oxymetazoline) every 6-8 hours for up to 3 days.   Allergies - helps relieve runny nose, itchy eyes and sneezing   Claritin (generic loratidine), Allegra (fexofenidine), or Zyrtec (generic cyrterizine) for runny nose. These medications should not cause drowsiness.  Note - Benadryl (generic diphenhydramine) may be used however may cause drowsiness  Cough -   Delsym or Robitussin (generic dextromethorphan)  Expectorants - helps loosen mucus to ease removal   Mucinex (generic guaifenesin) as directed on the package.  Headaches / General Aches   Tylenol (generic acetaminophen) - DO NOT EXCEED 3 grams (3,000 mg) in a 24 hour time period  Advil/Motrin (generic ibuprofen)   Sore Throat -   Salt water gargle   Chloraseptic (generic benzocaine) spray or lozenges / Sucrets (generic dyclonine)

## 2018-04-29 NOTE — Assessment & Plan Note (Signed)
Stable with current Genvoya. Obtain CD4 and viral load today. Continue Genvoya and follow up with Dr. Megan Salon as scheduled.

## 2018-04-29 NOTE — Progress Notes (Signed)
Subjective:    Patient ID: Angela Crane, female    DOB: 1961/09/22, 57 y.o.   MRN: [id number]  Chief Complaint  Patient presents with  . Cough     HPI:  Angela Crane is a 57 y.o. female who presents today for an acute office visit.   This is a new problem. Associated symptoms of sore throat, productive cough, and decreased appetite has been going for about 1 weeks. Modifying factors include Mucinex and Chloroseptic. Course of the symptoms have stayed about the same with no significant improvement. Does not take her temperature but indicates that she feels feverish. No sick contacts that she is aware of. Requesting to check her viral load and CD4 count. Continues to take her Genvoya as prescribed with no adverse side effects.    Allergies  Allergen Reactions  . Sulfonamide Derivatives     REACTION: dangerous inflamation      Outpatient Medications Prior to Visit  Medication Sig Dispense Refill  . amLODipine (NORVASC) 10 MG tablet TAKE 1 TABLET BY MOUTH EVERY DAY 90 tablet 2  . citalopram (CELEXA) 20 MG tablet TAKE 2 TABLETS BY MOUTH EVERY DAY 60 tablet 5  . GENVOYA 150-150-200-10 MG TABS tablet TAKE 1 TABLET BY MOUTH DAILY WITH BREAKFAST 30 tablet 11  . hydrochlorothiazide (HYDRODIURIL) 25 MG tablet TAKE 1 TABLET BY MOUTH EVERY DAY 90 tablet 3  . Investigational - Study Medication Study name: The [place] Trial Additional study details: She will be taking tamoxifen 20 mg po daily for 38 days.  This is study provided. Please do not fill prescription. (Patient not taking: Reported on 08/30/2017) 1 each PRN  . loratadine (CLARITIN) 10 MG tablet Take 10 mg by mouth daily.      . naproxen sodium (ANAPROX) 220 MG tablet Take 220 mg by mouth 2 (two) times daily with a meal.    . Pitavastatin Calcium 4 MG TABS Take 1 tablet (4 mg total) by mouth daily. 30 tablet 11   No facility-administered medications prior to visit.      Past Medical History:  Diagnosis Date  . HIV (human  immunodeficiency virus infection) ([place])   . Hypertension   . Menopausal and perimenopausal disorder      History reviewed. No pertinent surgical history.     Review of Systems  Constitutional: Positive for appetite change and fever.  HENT: Positive for congestion and sore throat. Negative for sinus pressure, sinus pain and trouble swallowing.   Respiratory: Positive for cough. Negative for shortness of breath and wheezing.   Cardiovascular: Negative for chest pain.  Gastrointestinal: Positive for vomiting. Negative for constipation, diarrhea and nausea.      Objective:    BP 132/87   Pulse 85   Temp 98.4 F (36.9 C)   Ht 5\' 8"  (1.727 m)   Wt 269 lb 12.8 oz (122.4 kg)   LMP 08/20/2013   SpO2 96%   BMI 41.02 kg/m  Nursing note and vital signs reviewed.  Physical Exam  Constitutional: She is oriented to person, place, and time. She appears well-developed and well-nourished. No distress.  HENT:  Right Ear: Hearing, tympanic membrane, external ear and ear canal normal.  Left Ear: Hearing, tympanic membrane, external ear and ear canal normal.  Nose: No mucosal edema. Right sinus exhibits no maxillary sinus tenderness and no frontal sinus tenderness. Left sinus exhibits no maxillary sinus tenderness and no frontal sinus tenderness.  Mouth/Throat: Uvula is midline, oropharynx is clear and moist and mucous membranes are  normal.  Cardiovascular: Normal rate, regular rhythm, normal heart sounds and intact distal pulses. Exam reveals no gallop and no friction rub.  No murmur heard. Pulmonary/Chest: Effort normal and breath sounds normal. No stridor. No respiratory distress. She has no wheezes. She has no rales. She exhibits no tenderness.  Neurological: She is alert and oriented to person, place, and time.  Skin: Skin is warm and dry.  Psychiatric: She has a normal mood and affect. Her behavior is normal. Judgment and thought content normal.       Assessment & Plan:   Problem  List Items Addressed This Visit      Other   Human immunodeficiency virus (HIV) disease ([place])    Stable with current Genvoya. Obtain CD4 and viral load today. Continue Genvoya and follow up with Dr. Megan Salon as scheduled.       Relevant Orders   T-helper cell (CD4)- ([facility] clinic only)   HIV 1 RNA quant-no reflex-bld   CBC w/Diff   COUGH - Primary    Symptoms and exam appear consistent with acute upper respiratory infection most likely viral. Could be early bronchitis. Recommend over the counter medications as needed for symptom relief and supportive care. Will try low dose prednisone to help with the cough. Follow up if symptoms worsen or do not improve.       Relevant Medications   predniSONE (DELTASONE) 10 MG tablet   Other Relevant Orders   CBC w/Diff       I am having Herbert [place] start on predniSONE. I am also having her maintain her loratadine, naproxen sodium, Pitavastatin Calcium, hydrochlorothiazide, Investigational - Study Medication, GENVOYA, amLODipine, and citalopram.   Meds ordered this encounter  Medications  . predniSONE (DELTASONE) 10 MG tablet    Sig: Take 1 tablet (10 mg total) by mouth 2 (two) times daily with a meal.    Dispense:  10 tablet    Refill:  0    Order Specific Question:   Supervising Provider    Answer:   Carlyle Basques [[id number]]     Follow-up: Return if symptoms worsen or fail to improve.   Terri Piedra, MSN, [facility]

## 2018-04-30 ENCOUNTER — Telehealth: Payer: Self-pay | Admitting: *Deleted

## 2018-04-30 LAB — T-HELPER CELL (CD4) - (RCID CLINIC ONLY)
CD4 % Helper T Cell: 48 % (ref 33–55)
CD4 T CELL ABS: 1990 /uL (ref 400–2700)

## 2018-04-30 NOTE — Telephone Encounter (Signed)
Just remind me and I'll take care of it

## 2018-04-30 NOTE — Telephone Encounter (Signed)
Patient asked if Dr Tommy Medal would write a memorandum or statement of health for her for immigration purposes.  She would like it to be on generic [place] letterhead, addressed to "to whom it may concern" and state that she has been a patient here for 10+ years, "has a chronic condition that is well managed."Landis Gandy, RN

## 2018-05-01 ENCOUNTER — Encounter (INDEPENDENT_AMBULATORY_CARE_PROVIDER_SITE_OTHER): Payer: Self-pay

## 2018-05-01 LAB — HIV-1 RNA QUANT-NO REFLEX-BLD
HIV 1 RNA QUANT: NOT DETECTED {copies}/mL
HIV-1 RNA Quant, Log: <1.3 Log copies/mL

## 2018-05-02 ENCOUNTER — Other Ambulatory Visit: Payer: Self-pay | Admitting: *Deleted

## 2018-05-07 ENCOUNTER — Encounter: Payer: Self-pay | Admitting: Infectious Disease

## 2018-05-07 ENCOUNTER — Telehealth: Payer: Self-pay

## 2018-05-07 NOTE — Telephone Encounter (Signed)
Pt called today requesting a letter of health for immigration purposes. PT stated she would need the letter by Thursday. Will route message to Dr. Tommy Medal and see if he would write this letter for the pt. [place]

## 2018-05-07 NOTE — Telephone Encounter (Signed)
I will write letter and have it signed before I leave clinic tomorrow am

## 2018-06-04 ENCOUNTER — Encounter: Payer: Self-pay | Admitting: *Deleted

## 2018-06-11 ENCOUNTER — Other Ambulatory Visit: Payer: Self-pay | Admitting: Internal Medicine

## 2018-06-11 DIAGNOSIS — I10 Essential (primary) hypertension: Secondary | ICD-10-CM

## 2018-06-27 ENCOUNTER — Encounter (INDEPENDENT_AMBULATORY_CARE_PROVIDER_SITE_OTHER): Payer: Self-pay | Admitting: *Deleted

## 2018-06-27 VITALS — BP 169/116 | HR 93 | Temp 98.5°F | Wt 278.2 lb

## 2018-06-27 DIAGNOSIS — Z006 Encounter for examination for normal comparison and control in clinical research program: Secondary | ICD-10-CM

## 2018-06-27 NOTE — Progress Notes (Signed)
Angela Crane was here for her month 71 study visit for Reprieve. She denies any new problems, but her BP was up at this visit. Left arm was 169/116 and 155/102 on the right. She said she took her BP medicine this morning and just needed to rest. She denies any muscle aches or weakness and is very adherent with all her meds. She will be returning in November for the next visit.

## 2018-07-03 ENCOUNTER — Encounter: Payer: Self-pay | Admitting: Internal Medicine

## 2018-07-04 ENCOUNTER — Encounter (INDEPENDENT_AMBULATORY_CARE_PROVIDER_SITE_OTHER): Payer: Self-pay | Admitting: *Deleted

## 2018-07-04 DIAGNOSIS — Z006 Encounter for examination for normal comparison and control in clinical research program: Secondary | ICD-10-CM

## 2018-07-04 NOTE — Progress Notes (Signed)
Angela Crane is here for her month 36 visit for A5361s, Pitavastatin to REduce Physical Function Impairment and FRailty in HIV (substudy of Reprieve). No new complaints or concerns. She will return in November for her next Reprieve study visit.

## 2018-07-12 ENCOUNTER — Other Ambulatory Visit: Payer: Self-pay | Admitting: Internal Medicine

## 2018-07-12 DIAGNOSIS — Z1231 Encounter for screening mammogram for malignant neoplasm of breast: Secondary | ICD-10-CM

## 2018-07-19 ENCOUNTER — Telehealth (HOSPITAL_COMMUNITY): Payer: Self-pay

## 2018-07-19 NOTE — Telephone Encounter (Signed)
Tried to reach voicemail was not setup °

## 2018-08-19 ENCOUNTER — Other Ambulatory Visit: Payer: Self-pay

## 2018-08-19 DIAGNOSIS — B20 Human immunodeficiency virus [HIV] disease: Secondary | ICD-10-CM

## 2018-08-20 ENCOUNTER — Other Ambulatory Visit: Payer: No Typology Code available for payment source

## 2018-08-20 LAB — T-HELPER CELL (CD4) - (RCID CLINIC ONLY)
CD4 % Helper T Cell: 43 % (ref 33–55)
CD4 T Cell Abs: 1540 /uL (ref 400–2700)

## 2018-08-21 LAB — CBC
HCT: 41.9 % (ref 35.0–45.0)
HEMOGLOBIN: 14.1 g/dL (ref 11.7–15.5)
MCH: 27.6 pg (ref 27.0–33.0)
MCHC: 33.7 g/dL (ref 32.0–36.0)
MCV: 82.2 fL (ref 80.0–100.0)
MPV: 10.8 fL (ref 7.5–12.5)
Platelets: 292 10*3/uL (ref 140–400)
RBC: 5.1 10*6/uL (ref 3.80–5.10)
RDW: 14.5 % (ref 11.0–15.0)
WBC: 7.4 10*3/uL (ref 3.8–10.8)

## 2018-08-21 LAB — LIPID PANEL
CHOL/HDL RATIO: 5.2 (calc) — AB (ref <5.0)
CHOLESTEROL: 212 mg/dL — AB (ref <200)
HDL: 41 mg/dL — AB (ref >50)
LDL Cholesterol (Calc): 133 mg/dL (calc) — ABNORMAL HIGH
NON-HDL CHOLESTEROL (CALC): 171 mg/dL — AB (ref <130)
Triglycerides: 248 mg/dL — ABNORMAL HIGH (ref <150)

## 2018-08-21 LAB — COMPREHENSIVE METABOLIC PANEL
AG Ratio: 1.1 (calc) (ref 1.0–2.5)
ALKALINE PHOSPHATASE (APISO): 68 U/L (ref 33–130)
ALT: 12 U/L (ref 6–29)
AST: 20 U/L (ref 10–35)
Albumin: 3.9 g/dL (ref 3.6–5.1)
BILIRUBIN TOTAL: 0.3 mg/dL (ref 0.2–1.2)
BUN: 11 mg/dL (ref 7–25)
CALCIUM: 9.4 mg/dL (ref 8.6–10.4)
CO2: 29 mmol/L (ref 20–32)
CREATININE: 0.94 mg/dL (ref 0.50–1.05)
Chloride: 102 mmol/L (ref 98–110)
Globulin: 3.4 g/dL (calc) (ref 1.9–3.7)
Glucose, Bld: 121 mg/dL — ABNORMAL HIGH (ref 65–99)
Potassium: 4.8 mmol/L (ref 3.5–5.3)
Sodium: 138 mmol/L (ref 135–146)
Total Protein: 7.3 g/dL (ref 6.1–8.1)

## 2018-08-21 LAB — HIV-1 RNA QUANT-NO REFLEX-BLD
HIV 1 RNA Quant: 37 copies/mL — ABNORMAL HIGH
HIV-1 RNA Quant, Log: 1.57 Log copies/mL — ABNORMAL HIGH

## 2018-08-21 LAB — RPR: RPR: NONREACTIVE

## 2018-09-03 ENCOUNTER — Ambulatory Visit: Payer: No Typology Code available for payment source | Admitting: Internal Medicine

## 2018-09-04 ENCOUNTER — Encounter: Payer: Self-pay | Admitting: Internal Medicine

## 2018-09-04 ENCOUNTER — Ambulatory Visit (INDEPENDENT_AMBULATORY_CARE_PROVIDER_SITE_OTHER): Payer: Self-pay | Admitting: Internal Medicine

## 2018-09-04 VITALS — BP 141/86 | HR 84 | Temp 98.3°F | Ht 69.0 in | Wt 274.0 lb

## 2018-09-04 DIAGNOSIS — M79604 Pain in right leg: Secondary | ICD-10-CM

## 2018-09-04 DIAGNOSIS — B20 Human immunodeficiency virus [HIV] disease: Secondary | ICD-10-CM

## 2018-09-04 DIAGNOSIS — N898 Other specified noninflammatory disorders of vagina: Secondary | ICD-10-CM

## 2018-09-04 DIAGNOSIS — M79605 Pain in left leg: Secondary | ICD-10-CM

## 2018-09-04 DIAGNOSIS — Z23 Encounter for immunization: Secondary | ICD-10-CM

## 2018-09-04 MED ORDER — FLUCONAZOLE 100 MG PO TABS: 100.0000 mg | ORAL_TABLET | Freq: Every day | ORAL | 0 refills | Status: AC

## 2018-09-04 NOTE — Progress Notes (Signed)
Patient Active Problem List   Diagnosis Date Noted  . Human immunodeficiency virus (HIV) disease ([place]) 05/20/2007    Priority: High  . Leg pain 04/18/2018  . Vaginal pruritus 09/04/2013  . Perimenopausal atrophic vaginitis 09/04/2013  . Menopausal and perimenopausal disorder   . Obesity 11/08/2011  . Dyslipidemia 05/11/2011  . ESSENTIAL HYPERTENSION 04/27/2010  . ABSCESS, TOOTH 10/09/2008  . COUGH 04/22/2008  . DISORDER, DEPRESSIVE NEC 05/20/2007    Patient's Medications  New Prescriptions   FLUCONAZOLE (DIFLUCAN) 100 MG TABLET    Take 1 tablet (100 mg total) by mouth daily for 3 days.  Previous Medications   AMLODIPINE (NORVASC) 10 MG TABLET    TAKE 1 TABLET BY MOUTH EVERY DAY   CITALOPRAM (CELEXA) 20 MG TABLET    TAKE 2 TABLETS BY MOUTH EVERY DAY   GENVOYA 150-150-200-10 MG TABS TABLET    TAKE 1 TABLET BY MOUTH DAILY WITH BREAKFAST   HYDROCHLOROTHIAZIDE (HYDRODIURIL) 25 MG TABLET    TAKE 1 TABLET BY MOUTH EVERY DAY   LORATADINE (CLARITIN) 10 MG TABLET    Take 10 mg by mouth daily.     NAPROXEN SODIUM (ANAPROX) 220 MG TABLET    Take 220 mg by mouth 2 (two) times daily with a meal.   PITAVASTATIN CALCIUM 4 MG TABS    Take 1 tablet (4 mg total) by mouth daily.   PREDNISONE (DELTASONE) 10 MG TABLET    Take 1 tablet (10 mg total) by mouth 2 (two) times daily with a meal.  Modified Medications   No medications on file  Discontinued Medications   No medications on file    Subjective: Angela Crane is in for her routine HIV follow-up visit.  She has had no problems obtaining, taking or tolerating her Genvoya and never misses a single dose.  The pain and swelling that she had in her legs earlier this year resolve spontaneously.  She has had some recent vaginal itching and she thinks that she has a yeast infection.  Review of Systems: Review of Systems  Constitutional: Negative for chills, diaphoresis, fever, malaise/fatigue and weight loss.  HENT: Negative for sore throat.     Respiratory: Negative for cough, sputum production and shortness of breath.   Cardiovascular: Negative for chest pain.  Gastrointestinal: Negative for abdominal pain, diarrhea, heartburn, nausea and vomiting.  Genitourinary: Negative for dysuria and frequency.       Vaginal itching as noted in HPI.  Musculoskeletal: Negative for joint pain and myalgias.  Skin: Negative for rash.  Neurological: Negative for dizziness and headaches.  Psychiatric/Behavioral: Negative for depression and substance abuse. The patient is not nervous/anxious.     Past Medical History:  Diagnosis Date  . HIV (human immunodeficiency virus infection) ([place])   . Hypertension   . Menopausal and perimenopausal disorder     Social History   Tobacco Use  . Smoking status: Never Smoker  . Smokeless tobacco: Never Used  Substance Use Topics  . Alcohol use: No  . Drug use: No    Family History  Problem Relation Age of Onset  . HIV Daughter   . Diabetes Mother     Allergies  Allergen Reactions  . Sulfonamide Derivatives     REACTION: dangerous inflamation    Health Maintenance  Topic Date Due  . COLONOSCOPY  10/26/2010  . PAP SMEAR  05/18/2018  . INFLUENZA VACCINE  07/11/2018  . MAMMOGRAM  05/31/2019  . TETANUS/TDAP  05/19/2027  .  Hepatitis C Screening  Completed  . HIV Screening  Completed    Objective:  Vitals:   09/04/18 0853  BP: (!) 141/86  Pulse: 84  Temp: 98.3 F (36.8 C)  Weight: 274 lb (124.3 kg)  Height: 5\' 9"  (1.753 m)   Body mass index is 40.46 kg/m.  Physical Exam  Constitutional: She is oriented to person, place, and time.  HENT:  Mouth/Throat: No oropharyngeal exudate.  Eyes: Conjunctivae are normal.  Cardiovascular: Normal rate, regular rhythm and normal heart sounds.  No murmur heard. Pulmonary/Chest: Breath sounds normal.  Abdominal: Soft. She exhibits no mass. There is no tenderness.  Musculoskeletal: Normal range of motion.  Neurological: She is alert and  oriented to person, place, and time.  Skin: No rash noted.  Psychiatric: She has a normal mood and affect.    Lab Results Lab Results  Component Value Date   WBC 7.4 08/19/2018   HGB 14.1 08/19/2018   HCT 41.9 08/19/2018   MCV 82.2 08/19/2018   PLT 292 08/19/2018    Lab Results  Component Value Date   CREATININE 0.94 08/19/2018   BUN 11 08/19/2018   NA 138 08/19/2018   K 4.8 08/19/2018   CL 102 08/19/2018   CO2 29 08/19/2018    Lab Results  Component Value Date   ALT 12 08/19/2018   AST 20 08/19/2018   ALKPHOS 65 08/09/2017   BILITOT 0.3 08/19/2018    Lab Results  Component Value Date   CHOL 212 (H) 08/19/2018   HDL 41 (L) 08/19/2018   LDLCALC 133 (H) 08/19/2018   TRIG 248 (H) 08/19/2018   CHOLHDL 5.2 (H) 08/19/2018   Lab Results  Component Value Date   LABRPR NON-REACTIVE 08/19/2018   HIV 1 RNA Quant (copies/mL)  Date Value  08/19/2018 37 (H)  04/29/2018 <20 NOT DETECTED  05/04/2017 <20 DETECTED (A)   HIV-1 RNA Viral Load (no units)  Date Value  07/23/2017 <20  06/25/2017 <20  03/21/2016 41   CD4 (no units)  Date Value  03/21/2016 1,958  01/05/2016 1,485  10/13/2015 1,147   CD4 T Cell Abs (/uL)  Date Value  08/19/2018 1,540  04/29/2018 1,990  10/03/2016 2,170     Problem List Items Addressed This Visit      High   Human immunodeficiency virus (HIV) disease ([place])    Her infection remains under very good long-term control.  She will continue Genvoya and follow-up after lab work in 1 year.  She received her Prevnar and influenza vaccinations today.      Relevant Medications   fluconazole (DIFLUCAN) 100 MG tablet   Other Relevant Orders   CBC   T-helper cell (CD4)- ([facility] clinic only)   Comprehensive metabolic panel   Lipid panel   RPR   HIV-1 RNA quant-no reflex-bld     Unprioritized   Vaginal pruritus    I will treat her with empiric fluconazole.      Relevant Medications   fluconazole (DIFLUCAN) 100 MG tablet   Leg pain    Her  pain and swelling have resolved spontaneously.           Michel Bickers, MD [facility] for [facility] [place] [facility] [phone] pager   [phone] cell 09/04/2018, 9:10 AM

## 2018-09-04 NOTE — Addendum Note (Signed)
Addended by: Eugenia Mcalpine on: 09/04/2018 09:15 AM   Modules accepted: Orders

## 2018-09-04 NOTE — Assessment & Plan Note (Signed)
I will treat her with empiric fluconazole. 

## 2018-09-04 NOTE — Assessment & Plan Note (Signed)
Her pain and swelling have resolved spontaneously.

## 2018-09-04 NOTE — Assessment & Plan Note (Signed)
Her infection remains under very good long-term control.  She will continue Genvoya and follow-up after lab work in 1 year.  She received her Prevnar and influenza vaccinations today.

## 2018-09-04 NOTE — Progress Notes (Signed)
Prevnar and Flu vaccines administered in office today. Patient tolerated well.  S.Edwin Cherian LPN

## 2018-09-10 ENCOUNTER — Other Ambulatory Visit: Payer: Self-pay | Admitting: Internal Medicine

## 2018-09-10 DIAGNOSIS — B20 Human immunodeficiency virus [HIV] disease: Secondary | ICD-10-CM

## 2018-10-02 ENCOUNTER — Telehealth: Payer: Self-pay | Admitting: *Deleted

## 2018-10-02 ENCOUNTER — Encounter: Payer: Self-pay | Admitting: *Deleted

## 2018-10-02 DIAGNOSIS — E785 Hyperlipidemia, unspecified: Secondary | ICD-10-CM

## 2018-10-02 DIAGNOSIS — M79672 Pain in left foot: Secondary | ICD-10-CM

## 2018-10-02 DIAGNOSIS — Z0000 Encounter for general adult medical examination without abnormal findings: Secondary | ICD-10-CM

## 2018-10-02 DIAGNOSIS — G8929 Other chronic pain: Secondary | ICD-10-CM

## 2018-10-02 NOTE — Telephone Encounter (Addendum)
Patient walked in to clinic, asked to speak with a nurse regarding left foot pain. She states she fell down 3 flights of stairs in 2016 and will have occassional flare ups of pain in her left foot where it makes it difficult for her to walk or stand. RN asked if patient had attempted conservative treatment (rest, ice, compression, elevation). Patient states she uses cool water and tylenol. RN wrapped patient's ankle with ace bandage in clinic.  She is asking for a prescription of voltaren gel to be sent to the pharmacy where her daughter works - Paediatric nurse in [place], [place].  RN added this pharmacy in patient's profile. Landis Gandy, RN

## 2018-10-03 ENCOUNTER — Other Ambulatory Visit: Payer: Self-pay | Admitting: Internal Medicine

## 2018-10-03 DIAGNOSIS — M79605 Pain in left leg: Secondary | ICD-10-CM

## 2018-10-03 MED ORDER — DICLOFENAC SODIUM 1 % TD GEL: 2.0000 g | Freq: Four times a day (QID) | TRANSDERMAL | 2 refills | Status: DC

## 2018-10-03 MED ORDER — DICLOFENAC SODIUM 1 % TD GEL: 2.0000 g | Freq: Four times a day (QID) | TRANSDERMAL | 2 refills | Status: AC

## 2018-10-03 NOTE — Telephone Encounter (Signed)
[facility] called back.  RN relayed information. Will send referral for PCP. Patient asked which provider you would prefer her to see.

## 2018-10-03 NOTE — Telephone Encounter (Signed)
Attempted to call patient x1. No answer, voicemail not yet set up.

## 2018-10-03 NOTE — Telephone Encounter (Signed)
I sent in a prescription for Voltaren gel.  Please see if we can get her a PCP.

## 2018-10-04 NOTE — Telephone Encounter (Signed)
Whichever network PCP is available and most convenient for her.

## 2018-10-08 NOTE — Addendum Note (Signed)
Addended by: Landis Gandy on: 10/08/2018 05:23 PM   Modules accepted: Orders

## 2018-10-14 ENCOUNTER — Other Ambulatory Visit: Payer: Self-pay

## 2018-10-14 DIAGNOSIS — F32A Depression, unspecified: Secondary | ICD-10-CM

## 2018-10-14 DIAGNOSIS — F329 Major depressive disorder, single episode, unspecified: Secondary | ICD-10-CM

## 2018-10-14 MED ORDER — CITALOPRAM HYDROBROMIDE 20 MG PO TABS: 40.0000 mg | ORAL_TABLET | Freq: Every day | ORAL | 5 refills | Status: DC

## 2018-10-21 ENCOUNTER — Encounter (INDEPENDENT_AMBULATORY_CARE_PROVIDER_SITE_OTHER): Payer: Self-pay

## 2018-10-21 VITALS — BP 142/91 | HR 91 | Temp 98.1°F | Wt 271.5 lb

## 2018-10-21 DIAGNOSIS — Z006 Encounter for examination for normal comparison and control in clinical research program: Secondary | ICD-10-CM

## 2018-10-21 NOTE — Research (Signed)
TRUE came in today for her month 27 Reprieve visit. She continues to take her study medication and Genvoya daily. She has had no cardiac events or muscle pains. She has minor muscle weakness from a previous injury. No changes in medication and no labs were collected today. Her month 48 visit is scheduled for March 25th.

## 2018-11-10 ENCOUNTER — Other Ambulatory Visit: Payer: Self-pay | Admitting: Internal Medicine

## 2018-11-10 DIAGNOSIS — I10 Essential (primary) hypertension: Secondary | ICD-10-CM

## 2018-11-20 ENCOUNTER — Ambulatory Visit: Payer: No Typology Code available for payment source

## 2018-11-20 ENCOUNTER — Encounter: Payer: Self-pay | Admitting: General Practice

## 2018-12-10 ENCOUNTER — Other Ambulatory Visit: Payer: Self-pay | Admitting: Internal Medicine

## 2018-12-10 DIAGNOSIS — I10 Essential (primary) hypertension: Secondary | ICD-10-CM

## 2019-01-16 ENCOUNTER — Encounter: Payer: Self-pay | Admitting: Internal Medicine

## 2019-02-06 ENCOUNTER — Other Ambulatory Visit: Payer: Self-pay | Admitting: Internal Medicine

## 2019-02-06 DIAGNOSIS — I10 Essential (primary) hypertension: Secondary | ICD-10-CM

## 2019-03-04 ENCOUNTER — Other Ambulatory Visit: Payer: Self-pay | Admitting: Internal Medicine

## 2019-03-04 DIAGNOSIS — B20 Human immunodeficiency virus [HIV] disease: Secondary | ICD-10-CM

## 2019-03-05 ENCOUNTER — Encounter: Payer: Self-pay | Admitting: *Deleted

## 2019-03-05 ENCOUNTER — Other Ambulatory Visit: Payer: Self-pay

## 2019-03-05 VITALS — BP 142/92 | HR 80 | Temp 98.3°F | Wt 269.0 lb

## 2019-03-05 DIAGNOSIS — Z006 Encounter for examination for normal comparison and control in clinical research program: Secondary | ICD-10-CM

## 2019-03-05 NOTE — Research (Signed)
Jonny is here for month 53 Reprieve study and substudy A5361s. No new complaints. Verbalized excellent adherence with her medications. Denied any muscle aches or weakness. She is scheduled to return in July for her next study visit.

## 2019-04-11 ENCOUNTER — Other Ambulatory Visit: Payer: Self-pay | Admitting: Internal Medicine

## 2019-04-11 DIAGNOSIS — F329 Major depressive disorder, single episode, unspecified: Secondary | ICD-10-CM

## 2019-04-11 DIAGNOSIS — F32A Depression, unspecified: Secondary | ICD-10-CM

## 2019-06-12 ENCOUNTER — Other Ambulatory Visit: Payer: Self-pay | Admitting: Internal Medicine

## 2019-06-12 DIAGNOSIS — I10 Essential (primary) hypertension: Secondary | ICD-10-CM

## 2019-07-03 ENCOUNTER — Encounter (INDEPENDENT_AMBULATORY_CARE_PROVIDER_SITE_OTHER): Payer: Self-pay

## 2019-07-03 ENCOUNTER — Other Ambulatory Visit: Payer: Self-pay

## 2019-07-03 DIAGNOSIS — Z006 Encounter for examination for normal comparison and control in clinical research program: Secondary | ICD-10-CM | POA: Insufficient documentation

## 2019-07-03 NOTE — Research (Signed)
Angela Crane came in today for her month 30 Reprieve visit. She is taking her study medication and Genvoya daily. She reports no muscle aches, no muscle weakness, and no medication changes. Covid assessment was completed and she was consented for v5.1.

## 2019-07-07 ENCOUNTER — Other Ambulatory Visit: Payer: Self-pay | Admitting: Internal Medicine

## 2019-07-07 DIAGNOSIS — B20 Human immunodeficiency virus [HIV] disease: Secondary | ICD-10-CM

## 2019-07-09 ENCOUNTER — Encounter: Payer: Self-pay | Admitting: Internal Medicine

## 2019-08-15 ENCOUNTER — Other Ambulatory Visit: Payer: Self-pay

## 2019-08-15 DIAGNOSIS — B20 Human immunodeficiency virus [HIV] disease: Secondary | ICD-10-CM

## 2019-08-15 DIAGNOSIS — Z113 Encounter for screening for infections with a predominantly sexual mode of transmission: Secondary | ICD-10-CM

## 2019-08-21 ENCOUNTER — Other Ambulatory Visit: Payer: Self-pay

## 2019-08-21 DIAGNOSIS — Z113 Encounter for screening for infections with a predominantly sexual mode of transmission: Secondary | ICD-10-CM

## 2019-08-21 DIAGNOSIS — B20 Human immunodeficiency virus [HIV] disease: Secondary | ICD-10-CM

## 2019-08-22 LAB — T-HELPER CELLS (CD4) COUNT (NOT AT ARMC)
CD4 % Helper T Cell: 52 % (ref 33–65)
CD4 T Cell Abs: 1532 /uL (ref 400–1790)

## 2019-08-23 LAB — URINE CYTOLOGY ANCILLARY ONLY
Chlamydia: NEGATIVE
Neisseria Gonorrhea: NEGATIVE

## 2019-08-27 LAB — LIPID PANEL
Cholesterol: 244 mg/dL — ABNORMAL HIGH (ref <200)
HDL: 41 mg/dL — ABNORMAL LOW (ref >=50)
LDL Cholesterol (Calc): 161 mg/dL (calc) — ABNORMAL HIGH
Non-HDL Cholesterol (Calc): 203 mg/dL (calc) — ABNORMAL HIGH (ref <130)
Total CHOL/HDL Ratio: 6 (calc) — ABNORMAL HIGH (ref <5.0)
Triglycerides: 257 mg/dL — ABNORMAL HIGH (ref <150)

## 2019-08-27 LAB — CBC WITH DIFFERENTIAL/PLATELET
Absolute Monocytes: 360 cells/uL (ref 200–950)
Basophils Absolute: 29 cells/uL (ref 0–200)
Basophils Relative: 0.4 %
Eosinophils Absolute: 101 cells/uL (ref 15–500)
Eosinophils Relative: 1.4 %
HCT: 41.6 % (ref 35.0–45.0)
Hemoglobin: 14.1 g/dL (ref 11.7–15.5)
Lymphs Abs: 3218 cells/uL (ref 850–3900)
MCH: 28.1 pg (ref 27.0–33.0)
MCHC: 33.9 g/dL (ref 32.0–36.0)
MCV: 82.9 fL (ref 80.0–100.0)
MPV: 11.1 fL (ref 7.5–12.5)
Monocytes Relative: 5 %
Neutro Abs: 3492 cells/uL (ref 1500–7800)
Neutrophils Relative %: 48.5 %
Platelets: 298 10*3/uL (ref 140–400)
RBC: 5.02 10*6/uL (ref 3.80–5.10)
RDW: 14.7 % (ref 11.0–15.0)
Total Lymphocyte: 44.7 %
WBC: 7.2 10*3/uL (ref 3.8–10.8)

## 2019-08-27 LAB — COMPREHENSIVE METABOLIC PANEL
AG Ratio: 1.3 (calc) (ref 1.0–2.5)
ALT: 11 U/L (ref 6–29)
AST: 16 U/L (ref 10–35)
Albumin: 4.5 g/dL (ref 3.6–5.1)
Alkaline phosphatase (APISO): 83 U/L (ref 37–153)
BUN: 11 mg/dL (ref 7–25)
CO2: 29 mmol/L (ref 20–32)
Calcium: 9.9 mg/dL (ref 8.6–10.4)
Chloride: 103 mmol/L (ref 98–110)
Creat: 0.77 mg/dL (ref 0.50–1.05)
Globulin: 3.5 g/dL (calc) (ref 1.9–3.7)
Glucose, Bld: 141 mg/dL — ABNORMAL HIGH (ref 65–99)
Potassium: 4 mmol/L (ref 3.5–5.3)
Sodium: 142 mmol/L (ref 135–146)
Total Bilirubin: 0.4 mg/dL (ref 0.2–1.2)
Total Protein: 8 g/dL (ref 6.1–8.1)

## 2019-08-27 LAB — RPR: RPR Ser Ql: NONREACTIVE

## 2019-08-27 LAB — HIV-1 RNA QUANT-NO REFLEX-BLD
HIV 1 RNA Quant: <20 copies/mL — AB
HIV-1 RNA Quant, Log: <1.3 Log copies/mL — AB

## 2019-09-04 DIAGNOSIS — R739 Hyperglycemia, unspecified: Secondary | ICD-10-CM | POA: Insufficient documentation

## 2019-09-08 ENCOUNTER — Other Ambulatory Visit: Payer: Self-pay

## 2019-09-08 ENCOUNTER — Encounter: Payer: Self-pay | Admitting: Internal Medicine

## 2019-09-08 ENCOUNTER — Ambulatory Visit (INDEPENDENT_AMBULATORY_CARE_PROVIDER_SITE_OTHER): Payer: Self-pay | Admitting: Internal Medicine

## 2019-09-08 DIAGNOSIS — B20 Human immunodeficiency virus [HIV] disease: Secondary | ICD-10-CM

## 2019-09-08 DIAGNOSIS — R739 Hyperglycemia, unspecified: Secondary | ICD-10-CM

## 2019-09-08 DIAGNOSIS — Z23 Encounter for immunization: Secondary | ICD-10-CM
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
## 2019-09-08 NOTE — Assessment & Plan Note (Signed)
Her infection remains under excellent, long-term control.  She will continue Genvoya and follow-up after lab work in 1 year.  She received her influenza vaccine today.  We will try again to get her a PCP.

## 2019-10-07 ENCOUNTER — Other Ambulatory Visit: Payer: Self-pay | Admitting: Internal Medicine

## 2019-10-07 DIAGNOSIS — F329 Major depressive disorder, single episode, unspecified: Secondary | ICD-10-CM

## 2019-10-07 DIAGNOSIS — F32A Depression, unspecified: Secondary | ICD-10-CM

## 2019-10-16 ENCOUNTER — Other Ambulatory Visit: Payer: Self-pay

## 2019-10-16 ENCOUNTER — Encounter (INDEPENDENT_AMBULATORY_CARE_PROVIDER_SITE_OTHER): Payer: Self-pay | Admitting: *Deleted

## 2019-10-16 VITALS — BP 115/76 | HR 79 | Temp 99.1°F | Wt 275.5 lb

## 2019-10-16 DIAGNOSIS — Z006 Encounter for examination for normal comparison and control in clinical research program: Secondary | ICD-10-CM

## 2019-10-16 NOTE — Research (Signed)
Angela Crane was here for her month 36 visit for Reprieve, A Randomized Trial to Prevent Vascular Events in HIV (study drug is Pitavastatin 4mg  or placebo).  She denies any new problems currently. She has not worked since the pandemic began and has not been sick. She will return in March.

## 2019-11-11 ENCOUNTER — Other Ambulatory Visit: Payer: Self-pay | Admitting: Internal Medicine

## 2019-11-11 DIAGNOSIS — B20 Human immunodeficiency virus [HIV] disease: Secondary | ICD-10-CM

## 2019-11-20 ENCOUNTER — Other Ambulatory Visit: Payer: Self-pay

## 2019-11-20 DIAGNOSIS — Z20822 Contact with and (suspected) exposure to covid-19: Secondary | ICD-10-CM

## 2019-11-23 LAB — NOVEL CORONAVIRUS, NAA: SARS-CoV-2, NAA: NOT DETECTED

## 2020-01-16 ENCOUNTER — Other Ambulatory Visit: Payer: Self-pay | Admitting: *Deleted

## 2020-01-16 DIAGNOSIS — I10 Essential (primary) hypertension: Secondary | ICD-10-CM

## 2020-01-16 MED ORDER — HYDROCHLOROTHIAZIDE 25 MG PO TABS: 25.0000 mg | ORAL_TABLET | Freq: Every day | ORAL | 1 refills | Status: DC

## 2020-02-11 ENCOUNTER — Other Ambulatory Visit: Payer: Self-pay | Admitting: Internal Medicine

## 2020-02-11 DIAGNOSIS — I10 Essential (primary) hypertension: Secondary | ICD-10-CM

## 2020-02-17 ENCOUNTER — Other Ambulatory Visit: Payer: Self-pay

## 2020-02-17 ENCOUNTER — Encounter (INDEPENDENT_AMBULATORY_CARE_PROVIDER_SITE_OTHER): Payer: Self-pay | Admitting: *Deleted

## 2020-02-17 VITALS — BP 143/85 | HR 71 | Temp 98.5°F | Wt 281.1 lb

## 2020-02-17 DIAGNOSIS — Z006 Encounter for examination for normal comparison and control in clinical research program: Secondary | ICD-10-CM

## 2020-02-17 NOTE — Research (Signed)
Angela Crane was here for her month 33 study visit for Reprieve, A Randomized Trial to Prevent Vascular Events in HIV (study drug is Pitavastatin 4mg  or placebo). She denies any new problems or concerns. She says her adherence is excellent with both the study med and her ARVs. She will be returning in July for the next study visit.

## 2020-02-27 ENCOUNTER — Ambulatory Visit: Payer: No Typology Code available for payment source | Attending: Internal Medicine

## 2020-02-27 DIAGNOSIS — Z20822 Contact with and (suspected) exposure to covid-19: Secondary | ICD-10-CM | POA: Insufficient documentation

## 2020-02-28 LAB — NOVEL CORONAVIRUS, NAA: SARS-CoV-2, NAA: NOT DETECTED

## 2020-03-17 ENCOUNTER — Other Ambulatory Visit: Payer: Self-pay

## 2020-03-17 ENCOUNTER — Ambulatory Visit: Payer: Self-pay

## 2020-03-29 ENCOUNTER — Ambulatory Visit (INDEPENDENT_AMBULATORY_CARE_PROVIDER_SITE_OTHER): Payer: Self-pay

## 2020-03-29 ENCOUNTER — Other Ambulatory Visit: Payer: Self-pay

## 2020-03-29 ENCOUNTER — Ambulatory Visit (INDEPENDENT_AMBULATORY_CARE_PROVIDER_SITE_OTHER): Payer: Self-pay | Admitting: Podiatrist

## 2020-03-29 ENCOUNTER — Encounter: Payer: Self-pay | Admitting: Podiatrist

## 2020-03-29 ENCOUNTER — Other Ambulatory Visit: Payer: Self-pay | Admitting: Podiatrist

## 2020-03-29 VITALS — Temp 97.9°F

## 2020-03-29 DIAGNOSIS — M659 Synovitis and tenosynovitis, unspecified: Secondary | ICD-10-CM

## 2020-03-29 DIAGNOSIS — M25572 Pain in left ankle and joints of left foot: Secondary | ICD-10-CM

## 2020-03-29 DIAGNOSIS — M19072 Primary osteoarthritis, left ankle and foot: Secondary | ICD-10-CM

## 2020-03-29 DIAGNOSIS — M65972 Unspecified synovitis and tenosynovitis, left ankle and foot: Secondary | ICD-10-CM

## 2020-03-29 NOTE — Progress Notes (Signed)
  Chief Complaint  Patient presents with  . Ankle Pain    L medial to midfoot; "sharp and throbbing pains after 8hrs of standing or walking; history of trauma to foot/ankles due to previous falls"     HPI: Patient is 59 y.o. female who presents today for the concerns as listed above.  She fell several years ago and started to have the foot pain at that time.  She is unable to stand more than 8 hours without pain in the left foot.    Review of Systems No fevers, chills, nausea, muscle aches, no difficulty breathing, no calf pain, no chest pain or shortness of breath.   Physical Exam  GENERAL APPEARANCE: Alert, conversant. Appropriately groomed. No acute distress.   VASCULAR: Pedal pulses palpable DP and PT bilateral.  Capillary refill time is immediate to all digits,  Proximal to distal cooling it warm to warm.  Digital hair growth is present bilateral   NEUROLOGIC: sensation is intact epicritically and protectively to 5.07 monofilament at 5/5 sites bilateral.  Light touch is intact bilateral, vibratory sensation intact bilateral, achilles tendon reflex is intact bilateral.   MUSCULOSKELETAL: acceptable muscle strength, tone and stability bilateral.  No gross boney pedal deformities noted.  No pain, crepitus or limitation noted with foot and ankle range of motion bilateral. Swelling at the anterior ankle near the talar head is noted.  Tibialis anterior tendon appears to be intact with normal muscle strength.    DERMATOLOGIC: skin is warm, supple, and dry.  No open lesions noted.  No rash, no pre ulcerative lesions. Digital nails are asymptomatic.  Swelling anterior medial ankle noted upon exam   xrays show arthritis and lipping of the talar head and midfoot on xray.  Pes planus deformity presint bilateral. No acute fracture seen. Bunion deformity is present  Assessment     ICD-10-CM   1. Left ankle pain, unspecified chronicity  M25.572 CANCELED: DG Ankle Complete Left    CANCELED: DG  Foot 2 Views Left  2. 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  Discussed we could try a steroid injection to see if this would help with the pain. The patient would like to proceed.  a sterile skin prep was applied.  An injection consisting of dexamethasone and marcaine mixture was infiltrated into midfoot region being careful to avoid the tibialis anterior tendon.  The patient tolerated this well and was given instructions for aftercare. She was also advised on a more robust ankle brace.  Would consider an mri in the future if the pain continues.

## 2020-03-29 NOTE — Patient Instructions (Addendum)
Arthritis Arthritis means joint pain. It can also mean joint disease. A joint is a place where bones come together. There are more than 100 types of arthritis. What are the causes? This condition may be caused by:  Wear and tear of a joint. This is the most common cause.  A lot of acid in the blood, which leads to pain in the joint (gout).  Pain and swelling (inflammation) in a joint.  Infection of a joint.  Injuries in the joint.  A reaction to medicines (allergy). In some cases, the cause may not be known. What are the signs or symptoms? Symptoms of this condition include:  Redness at a joint.  Swelling at a joint.  Stiffness at a joint.  Warmth coming from the joint.  A fever.  A feeling of being sick. How is this treated? This condition may be treated with:  Treating the cause, if it is known.  Rest.  Raising (elevating) the joint.  Putting cold or hot packs on the joint.  Medicines to treat symptoms and reduce pain and swelling.  Shots of medicines (cortisone) into the joint. You may also be told to make changes in your life, such as doing exercises and losing weight. Follow these instructions at home: Medicines  Take over-the-counter and prescription medicines only as told by your doctor.  Do not take aspirin for pain if your doctor says that you may have gout. Activity  Rest your joint if your doctor tells you to.  Avoid activities that make the pain worse.  Exercise your joint regularly as told by your doctor. Try doing exercises like: ? Swimming. ? Water aerobics. ? Biking. ? Walking. Managing pain, stiffness, and swelling      If told, put ice on the affected area. ? Put ice in a plastic bag. ? Place a towel between your skin and the bag. ? Leave the ice on for 20 minutes, 2-3 times per day.  If your joint is swollen, raise (elevate) it above the level of your heart if told by your doctor.  If your joint feels stiff in the morning,  try taking a warm shower.  If told, put heat on the affected area. Do this as often as told by your doctor. Use the heat source that your doctor recommends, such as a moist heat pack or a heating pad. If you have diabetes, do not apply heat without asking your doctor. To apply heat: ? Place a towel between your skin and the heat source. ? Leave the heat on for 20-30 minutes. ? Remove the heat if your skin turns bright red. This is very important if you are unable to feel pain, heat, or cold. You may have a greater risk of getting burned. General instructions  Do not use any products that contain nicotine or tobacco, such as cigarettes, e-cigarettes, and chewing tobacco. If you need help quitting, ask your doctor.  Keep all follow-up visits as told by your doctor. This is important. Contact a doctor if:  The pain gets worse.  You have a fever. Get help right away if:  You have very bad pain in your joint.  You have swelling in your joint.  Your joint is red.  Many joints become painful and swollen.  You have very bad back pain.  Your leg is very weak.  You cannot control your pee (urine) or poop (stool). Summary  Arthritis means joint pain. It can also mean joint disease. A joint is a place   where bones come together.  The most common cause of this condition is wear and tear of a joint.  Symptoms of this condition include redness, swelling, or stiffness of the joint.  This condition is treated with rest, raising the joint, medicines, and putting cold or hot packs on the joint.  Follow your doctor's instructions about medicines, activity, exercises, and other home care treatments. This information is not intended to replace advice given to you by your health care provider. Make sure you discuss any questions you have with your health care provider. Document Revised: 11/04/2018 Document Reviewed: 11/04/2018 Elsevier Patient Education  2020 [employer].   Try a more  restrictive ankle brace (you can find them at [place], or any of the sports stores)-  You will want one you can lace up or close with velcro so you can adjust it around your foot and ankle.

## 2020-04-10 ENCOUNTER — Other Ambulatory Visit: Payer: Self-pay | Admitting: Internal Medicine

## 2020-04-10 DIAGNOSIS — F32A Depression, unspecified: Secondary | ICD-10-CM

## 2020-04-10 DIAGNOSIS — F329 Major depressive disorder, single episode, unspecified: Secondary | ICD-10-CM

## 2020-04-14 ENCOUNTER — Other Ambulatory Visit: Payer: Self-pay | Admitting: *Deleted

## 2020-04-14 DIAGNOSIS — I10 Essential (primary) hypertension: Secondary | ICD-10-CM

## 2020-04-14 MED ORDER — AMLODIPINE BESYLATE 10 MG PO TABS: 10.0000 mg | ORAL_TABLET | Freq: Every day | ORAL | 5 refills | Status: DC

## 2020-04-14 MED ORDER — HYDROCHLOROTHIAZIDE 25 MG PO TABS: 25.0000 mg | ORAL_TABLET | Freq: Every day | ORAL | 5 refills | Status: DC

## 2020-05-05 ENCOUNTER — Other Ambulatory Visit: Payer: Self-pay | Admitting: *Deleted

## 2020-05-05 DIAGNOSIS — B20 Human immunodeficiency virus [HIV] disease: Secondary | ICD-10-CM

## 2020-05-05 MED ORDER — GENVOYA 150-150-200-10 MG PO TABS: 1.0000 | ORAL_TABLET | Freq: Every day | ORAL | 5 refills | Status: DC

## 2020-05-11 ENCOUNTER — Encounter: Payer: Self-pay | Admitting: Internal Medicine

## 2020-07-01 ENCOUNTER — Encounter (INDEPENDENT_AMBULATORY_CARE_PROVIDER_SITE_OTHER): Payer: Self-pay | Admitting: *Deleted

## 2020-07-01 ENCOUNTER — Other Ambulatory Visit: Payer: Self-pay

## 2020-07-01 VITALS — BP 159/93 | HR 73 | Temp 98.3°F | Wt 271.2 lb

## 2020-07-01 DIAGNOSIS — Z006 Encounter for examination for normal comparison and control in clinical research program: Secondary | ICD-10-CM

## 2020-07-01 NOTE — Research (Signed)
Angela Crane was here for her month 61 visit for Reprieve, A Randomized Trial to Prevent Vascular Events in HIV (study drug is Pitavastatin 4mg  or placebo). She said she went to the foot doctor in April for chronic lt ankle pain and had a steroid injection that only lasted a few days and has not been back. She did get her covid vaccines in March and April. She denies any other problems or concerns. She has been very adherent with her study med and will be returning in November for the next visit.

## 2020-07-26 ENCOUNTER — Other Ambulatory Visit: Payer: Self-pay

## 2020-07-26 ENCOUNTER — Encounter: Payer: Self-pay | Admitting: Podiatrist

## 2020-07-26 ENCOUNTER — Ambulatory Visit (INDEPENDENT_AMBULATORY_CARE_PROVIDER_SITE_OTHER): Payer: PRIVATE HEALTH INSURANCE | Admitting: Podiatrist

## 2020-07-26 DIAGNOSIS — M76812 Anterior tibial syndrome, left leg: Secondary | ICD-10-CM | POA: Diagnosis not present

## 2020-07-26 DIAGNOSIS — M25572 Pain in left ankle and joints of left foot: Secondary | ICD-10-CM

## 2020-07-26 DIAGNOSIS — M19072 Primary osteoarthritis, left ankle and foot: Secondary | ICD-10-CM | POA: Diagnosis not present

## 2020-07-26 NOTE — Progress Notes (Signed)
Order mri left ant tib tendon pain    Chief Complaint  Patient presents with   Follow-up    L ankle/midfoot. Pt stated, "It's definitely improved, but I'm still having pain. It's hard to describe it. I try to walk more - 10,000 feet in a day. The injection helped. I think I want to schedule a MRI".     HPI: Patient is 59 y.o. female who presents today for the concerns as listed above. She relates she still has pain as she points to the tibialis anterior tendon of her left foot. She states walking helps with the discomfort some.  Overall she still has pain when lifting her foot up and with ambulation.   Patient Active Problem List   Diagnosis Date Noted   Hyperglycemia 09/04/2019   Examination of participant in clinical trial 07/03/2019   Perimenopausal atrophic vaginitis 09/04/2013   Menopausal and perimenopausal disorder    Obesity 11/08/2011   Dyslipidemia 05/11/2011   ESSENTIAL HYPERTENSION 04/27/2010   Human immunodeficiency virus (HIV) disease ([place]) 05/20/2007   DISORDER, DEPRESSIVE NEC 05/20/2007    Current Outpatient Medications on File Prior to Visit  Medication Sig Dispense Refill   amLODipine (NORVASC) 10 MG tablet Take 1 tablet (10 mg total) by mouth daily. 30 tablet 5   citalopram (CELEXA) 20 MG tablet TAKE 2 TABLETS(40 MG) BY MOUTH DAILY 60 tablet 5   diclofenac sodium (VOLTAREN) 1 % GEL Apply 2 g topically 4 (four) times daily. 100 g 2   elvitegravir-cobicistat-emtricitabine-tenofovir (GENVOYA) 150-150-200-10 MG TABS tablet Take 1 tablet by mouth daily with breakfast. 30 tablet 5   hydrochlorothiazide (HYDRODIURIL) 25 MG tablet Take 1 tablet (25 mg total) by mouth daily. 30 tablet 5   loratadine (CLARITIN) 10 MG tablet Take 10 mg by mouth daily.       naproxen sodium (ANAPROX) 220 MG tablet Take 220 mg by mouth 2 (two) times daily with a meal.     Pitavastatin Calcium 4 MG TABS Take 1 tablet (4 mg total) by mouth daily. 30 tablet 11   No current  facility-administered medications on file prior to visit.    Allergies  Allergen Reactions   Sulfonamide Derivatives     REACTION: dangerous inflamation    Review of Systems No fevers, chills, nausea, muscle aches, no difficulty breathing, no calf pain, no chest pain or shortness of breath.   Physical Exam  Neurovascular status intact and unchanged,  Musculoskeletal exam reveals some discomfort along the posterior tibial tendon at the medial cuneiform and proximally to the level of the ankle joint.  Swelling around the tendon itself is appreciated.    Assessment     ICD-10-CM   1. Left ankle pain, unspecified chronicity  M25.572   2. Anterior tibialis tendonitis of left leg  M76.812   3. 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  I will set her up for one at [facility] and will see her back in a month to discuss results and treatment recommendations.

## 2020-08-12 ENCOUNTER — Encounter: Payer: Self-pay | Admitting: Internal Medicine

## 2020-08-17 ENCOUNTER — Other Ambulatory Visit: Payer: Self-pay | Admitting: Podiatrist

## 2020-08-20 ENCOUNTER — Other Ambulatory Visit: Payer: No Typology Code available for payment source

## 2020-08-30 ENCOUNTER — Ambulatory Visit: Payer: PRIVATE HEALTH INSURANCE | Admitting: Podiatrist

## 2020-09-07 ENCOUNTER — Other Ambulatory Visit: Payer: Self-pay

## 2020-09-07 ENCOUNTER — Other Ambulatory Visit: Payer: PRIVATE HEALTH INSURANCE

## 2020-09-07 DIAGNOSIS — B20 Human immunodeficiency virus [HIV] disease: Secondary | ICD-10-CM

## 2020-09-08 LAB — T-HELPER CELL (CD4) - (RCID CLINIC ONLY)
CD4 % Helper T Cell: 52 % (ref 33–65)
CD4 T Cell Abs: 1541 /uL (ref 400–1790)

## 2020-09-10 LAB — CBC
HCT: 41.7 % (ref 35.0–45.0)
Hemoglobin: 14 g/dL (ref 11.7–15.5)
MCH: 29.4 pg (ref 27.0–33.0)
MCHC: 33.6 g/dL (ref 32.0–36.0)
MCV: 87.6 fL (ref 80.0–100.0)
MPV: 11.4 fL (ref 7.5–12.5)
Platelets: 275 Thousand/uL (ref 140–400)
RBC: 4.76 Million/uL (ref 3.80–5.10)
RDW: 14.3 % (ref 11.0–15.0)
WBC: 6.6 Thousand/uL (ref 3.8–10.8)

## 2020-09-10 LAB — COMPREHENSIVE METABOLIC PANEL
AG Ratio: 1.3 (calc) (ref 1.0–2.5)
ALT: 11 U/L (ref 6–29)
AST: 22 U/L (ref 10–35)
Albumin: 4 g/dL (ref 3.6–5.1)
Alkaline phosphatase (APISO): 68 U/L (ref 37–153)
BUN: 9 mg/dL (ref 7–25)
CO2: 27 mmol/L (ref 20–32)
Calcium: 9.2 mg/dL (ref 8.6–10.4)
Chloride: 104 mmol/L (ref 98–110)
Creat: 0.73 mg/dL (ref 0.50–1.05)
Globulin: 3 g/dL (calc) (ref 1.9–3.7)
Glucose, Bld: 92 mg/dL (ref 65–99)
Potassium: 4.7 mmol/L (ref 3.5–5.3)
Sodium: 139 mmol/L (ref 135–146)
Total Bilirubin: 0.3 mg/dL (ref 0.2–1.2)
Total Protein: 7 g/dL (ref 6.1–8.1)

## 2020-09-10 LAB — LIPID PANEL
Cholesterol: 189 mg/dL (ref <200)
HDL: 46 mg/dL — ABNORMAL LOW (ref >=50)
LDL Cholesterol (Calc): 114 mg/dL (calc) — ABNORMAL HIGH
Non-HDL Cholesterol (Calc): 143 mg/dL (calc) — ABNORMAL HIGH (ref <130)
Total CHOL/HDL Ratio: 4.1 (calc) (ref <5.0)
Triglycerides: 176 mg/dL — ABNORMAL HIGH (ref <150)

## 2020-09-10 LAB — HIV-1 RNA QUANT-NO REFLEX-BLD
HIV 1 RNA Quant: 21 Copies/mL — ABNORMAL HIGH
HIV-1 RNA Quant, Log: 1.32 Log cps/mL — ABNORMAL HIGH

## 2020-09-10 LAB — RPR: RPR Ser Ql: NONREACTIVE

## 2020-09-14 ENCOUNTER — Other Ambulatory Visit: Payer: Self-pay | Admitting: *Deleted

## 2020-09-14 DIAGNOSIS — Z1231 Encounter for screening mammogram for malignant neoplasm of breast: Secondary | ICD-10-CM

## 2020-09-21 ENCOUNTER — Encounter: Payer: PRIVATE HEALTH INSURANCE | Admitting: Internal Medicine

## 2020-10-07 ENCOUNTER — Ambulatory Visit (INDEPENDENT_AMBULATORY_CARE_PROVIDER_SITE_OTHER): Payer: PRIVATE HEALTH INSURANCE

## 2020-10-07 ENCOUNTER — Other Ambulatory Visit: Payer: Self-pay

## 2020-10-07 ENCOUNTER — Ambulatory Visit (INDEPENDENT_AMBULATORY_CARE_PROVIDER_SITE_OTHER): Payer: PRIVATE HEALTH INSURANCE | Admitting: Internal Medicine

## 2020-10-07 ENCOUNTER — Encounter: Payer: Self-pay | Admitting: Internal Medicine

## 2020-10-07 VITALS — BP 160/100 | HR 73 | Temp 98.8°F | Wt 274.0 lb

## 2020-10-07 DIAGNOSIS — Z23 Encounter for immunization: Secondary | ICD-10-CM

## 2020-10-07 DIAGNOSIS — B20 Human immunodeficiency virus [HIV] disease: Secondary | ICD-10-CM

## 2020-10-07 MED ORDER — GENVOYA 150-150-200-10 MG PO TABS: 1.0000 | ORAL_TABLET | Freq: Every day | ORAL | 11 refills | Status: DC

## 2020-10-07 NOTE — Progress Notes (Signed)
   Covid-19 Vaccination Clinic  Name:  Angela Crane    MRN: [id number] DOB: [birth year]  10/07/2020  [name] was observed post Covid-19 immunization for 15 minutes without incident. She was provided with Vaccine Information Sheet and instruction to access the V-Safe system.   [name] was instructed to call 911 with any severe reactions post vaccine: Marland Kitchen Difficulty breathing  . Swelling of face and throat  . A fast heartbeat  . A bad rash all over body  . Dizziness and weakness     Corgan Mormile T Brooks Sailors

## 2020-10-07 NOTE — Assessment & Plan Note (Signed)
Her infection remains under very good control.  She will continue Genvoya and follow-up after lab work in 1 year.  We will get her influenza and Covid booster vaccines and help her find a PCP.

## 2020-10-07 NOTE — Progress Notes (Signed)
Patient Active Problem List   Diagnosis Date Noted  . Human immunodeficiency virus (HIV) disease ([place]) 05/20/2007    Priority: High  . Hyperglycemia 09/04/2019  . Examination of participant in clinical trial 07/03/2019  . Perimenopausal atrophic vaginitis 09/04/2013  . Menopausal and perimenopausal disorder   . Obesity 11/08/2011  . Dyslipidemia 05/11/2011  . ESSENTIAL HYPERTENSION 04/27/2010  . DISORDER, DEPRESSIVE NEC 05/20/2007    Patient's Medications  New Prescriptions   No medications on file  Previous Medications   AMLODIPINE (NORVASC) 10 MG TABLET    Take 1 tablet (10 mg total) by mouth daily.   CITALOPRAM (CELEXA) 20 MG TABLET    TAKE 2 TABLETS(40 MG) BY MOUTH DAILY   DICLOFENAC SODIUM (VOLTAREN) 1 % GEL    Apply 2 g topically 4 (four) times daily.   HYDROCHLOROTHIAZIDE (HYDRODIURIL) 25 MG TABLET    Take 1 tablet (25 mg total) by mouth daily.   LORATADINE (CLARITIN) 10 MG TABLET    Take 10 mg by mouth daily.     NAPROXEN SODIUM (ANAPROX) 220 MG TABLET    Take 220 mg by mouth 2 (two) times daily with a meal.   PITAVASTATIN CALCIUM 4 MG TABS    Take 1 tablet (4 mg total) by mouth daily.  Modified Medications   Modified Medication Previous Medication   ELVITEGRAVIR-COBICISTAT-EMTRICITABINE-TENOFOVIR (GENVOYA) 150-150-200-10 MG TABS TABLET elvitegravir-cobicistat-emtricitabine-tenofovir (GENVOYA) 150-150-200-10 MG TABS tablet      Take 1 tablet by mouth daily with breakfast.    Take 1 tablet by mouth daily with breakfast.  Discontinued Medications   No medications on file    Subjective: Angela Crane is in for her routine HIV follow-up visit.  She has not had any problems obtaining, taking or tolerating her Genvoya and never misses doses.  She is not on any new medications.  She received her Pfizer Covid vaccines in the spring and is eager to get her booster.  She is walking daily.  She is feeling well other than some left ankle pain.  She has not gotten a PCP  yet.  Review of Systems: Review of Systems  Constitutional: Negative for chills, diaphoresis, fever, malaise/fatigue and weight loss.  HENT: Negative for sore throat.   Respiratory: Negative for cough, sputum production and shortness of breath.   Cardiovascular: Negative for chest pain.  Gastrointestinal: Negative for abdominal pain, diarrhea, heartburn, nausea and vomiting.  Genitourinary: Negative for dysuria and frequency.  Musculoskeletal: Positive for joint pain. Negative for myalgias.  Skin: Negative for rash.  Neurological: Negative for dizziness and headaches.  Psychiatric/Behavioral: Negative for depression and substance abuse. The patient is not nervous/anxious.     Past Medical History:  Diagnosis Date  . HIV (human immunodeficiency virus infection) ([place])   . Hypertension   . Menopausal and perimenopausal disorder     Social History   Tobacco Use  . Smoking status: Never Smoker  . Smokeless tobacco: Never Used  Vaping Use  . Vaping Use: Never used  Substance Use Topics  . Alcohol use: No  . Drug use: No    Family History  Problem Relation Age of Onset  . HIV Daughter   . Diabetes Mother     Allergies  Allergen Reactions  . Sulfonamide Derivatives     REACTION: dangerous inflamation    Health Maintenance  Topic Date Due  . COLONOSCOPY  Never done  . PAP SMEAR-Modifier  05/18/2018  . MAMMOGRAM  05/31/2019  . INFLUENZA  VACCINE  07/11/2020  . TETANUS/TDAP  05/19/2027  . COVID-19 Vaccine  Completed  . Hepatitis C Screening  Completed  . HIV Screening  Completed    Objective:  Vitals:   10/07/20 0856  BP: (!) 160/100  Pulse: 73  Temp: 98.8 F (37.1 C)  TempSrc: Oral  Weight: 274 lb (124.3 kg)   Body mass index is 40.46 kg/m.  Physical Exam Constitutional:      Comments: She is in good spirits as usual.  Cardiovascular:     Rate and Rhythm: Normal rate and regular rhythm.     Heart sounds: No murmur heard.   Pulmonary:     Effort:  Pulmonary effort is normal.     Breath sounds: Normal breath sounds.  Psychiatric:        Mood and Affect: Mood normal.     Lab Results Lab Results  Component Value Date   WBC 6.6 09/07/2020   HGB 14.0 09/07/2020   HCT 41.7 09/07/2020   MCV 87.6 09/07/2020   PLT 275 09/07/2020    Lab Results  Component Value Date   CREATININE 0.73 09/07/2020   BUN 9 09/07/2020   NA 139 09/07/2020   K 4.7 09/07/2020   CL 104 09/07/2020   CO2 27 09/07/2020    Lab Results  Component Value Date   ALT 11 09/07/2020   AST 22 09/07/2020   ALKPHOS 65 08/09/2017   BILITOT 0.3 09/07/2020    Lab Results  Component Value Date   CHOL 189 09/07/2020   HDL 46 (L) 09/07/2020   LDLCALC 114 (H) 09/07/2020   TRIG 176 (H) 09/07/2020   CHOLHDL 4.1 09/07/2020   Lab Results  Component Value Date   LABRPR NON-REACTIVE 09/07/2020   HIV 1 RNA Quant  Date Value  09/07/2020 21 Copies/mL (H)  08/21/2019 <20 DETECTED copies/mL (A)  08/19/2018 37 copies/mL (H)   HIV-1 RNA Viral Load (no units)  Date Value  07/23/2017 <20  06/25/2017 <20  03/21/2016 41   CD4 (no units)  Date Value  03/21/2016 1,958  01/05/2016 1,485  10/13/2015 1,147   CD4 T Cell Abs (/uL)  Date Value  09/07/2020 1,541  08/21/2019 1,532  08/19/2018 1,540     Problem List Items Addressed This Visit      High   Human immunodeficiency virus (HIV) disease ([place])    Her infection remains under very good control.  She will continue Genvoya and follow-up after lab work in 1 year.  We will get her influenza and Covid booster vaccines and help her find a PCP.      Relevant Medications   elvitegravir-cobicistat-emtricitabine-tenofovir (GENVOYA) 150-150-200-10 MG TABS tablet   Other Relevant Orders   CBC   T-helper cell (CD4)- ([facility] clinic only)   Comprehensive metabolic panel   Lipid panel   RPR   HIV-1 RNA quant-no reflex-bld    Other Visit Diagnoses    HIV disease ([place])       Relevant Medications    elvitegravir-cobicistat-emtricitabine-tenofovir (GENVOYA) 150-150-200-10 MG TABS tablet        Michel Bickers, MD [facility] for [place] [phone] pager   [phone] cell 10/07/2020, 9:10 AM

## 2020-10-26 ENCOUNTER — Other Ambulatory Visit: Payer: Self-pay

## 2020-10-26 ENCOUNTER — Encounter (INDEPENDENT_AMBULATORY_CARE_PROVIDER_SITE_OTHER): Payer: PRIVATE HEALTH INSURANCE | Admitting: *Deleted

## 2020-10-26 VITALS — BP 135/85 | HR 75 | Temp 98.1°F

## 2020-10-26 DIAGNOSIS — Z006 Encounter for examination for normal comparison and control in clinical research program: Secondary | ICD-10-CM

## 2020-10-26 NOTE — Research (Signed)
Angela Crane was here for her month 66 visit for Reprieive. She denies any new problems and continues to have pain in her Left foot. Her adherence to study meds is excellent. She will be returning in 4 months for the next visit.

## 2020-11-07 ENCOUNTER — Other Ambulatory Visit: Payer: Self-pay | Admitting: Internal Medicine

## 2020-11-07 DIAGNOSIS — B20 Human immunodeficiency virus [HIV] disease: Secondary | ICD-10-CM

## 2020-12-10 ENCOUNTER — Other Ambulatory Visit: Payer: Self-pay | Admitting: Internal Medicine

## 2020-12-10 DIAGNOSIS — F32A Depression, unspecified: Secondary | ICD-10-CM

## 2021-01-12 ENCOUNTER — Other Ambulatory Visit: Payer: Self-pay | Admitting: Internal Medicine

## 2021-01-12 DIAGNOSIS — I10 Essential (primary) hypertension: Secondary | ICD-10-CM

## 2021-02-03 ENCOUNTER — Encounter: Payer: Self-pay | Admitting: *Deleted

## 2021-02-22 ENCOUNTER — Encounter (INDEPENDENT_AMBULATORY_CARE_PROVIDER_SITE_OTHER): Payer: PRIVATE HEALTH INSURANCE | Admitting: *Deleted

## 2021-02-22 ENCOUNTER — Other Ambulatory Visit: Payer: Self-pay

## 2021-02-22 VITALS — BP 151/95 | HR 80 | Temp 97.7°F | Wt 272.4 lb

## 2021-02-22 DIAGNOSIS — Z006 Encounter for examination for normal comparison and control in clinical research program: Secondary | ICD-10-CM

## 2021-02-22 NOTE — Research (Signed)
Angela Crane was here for the Reprieve and Prepare study visits. She denies any new problems or medications, She says she has been walking everyday as much as 6 miles on some days and feels very good. Her adherence is excellent. She will be returning in July for the next study visit.

## 2021-03-14 ENCOUNTER — Telehealth: Payer: Self-pay

## 2021-03-14 NOTE — Telephone Encounter (Signed)
Patient came to [facility] as a walk in to report that [facility] told her that her prescription expired for her Genvoya. Contacted pharmacy and they have the medication ready for pick up. They report that she has a copay of $1300 that she previously did not have. Patient has PCAP and should not have a copay as the state is responsible for picking up any balance. Pharmacy has her [employer] on file, as well as [employer] as secondary. Spoke with state PCAP representative who provided [place] with the correct secondary insurance information as the AA should not be used. Provided patient with the [employer] information and instructed her to go back to the pharmacy to provide them with this information and that should cover the medication.   [place] insurance information:  ID #: [id number] Group No: [id number] BIN: [id number] PCN: [id number]  Carlean Purl, RN

## 2021-04-08 ENCOUNTER — Other Ambulatory Visit: Payer: Self-pay | Admitting: Internal Medicine

## 2021-04-08 DIAGNOSIS — I10 Essential (primary) hypertension: Secondary | ICD-10-CM

## 2021-05-07 ENCOUNTER — Other Ambulatory Visit: Payer: Self-pay | Admitting: Internal Medicine

## 2021-05-07 DIAGNOSIS — I10 Essential (primary) hypertension: Secondary | ICD-10-CM

## 2021-05-16 ENCOUNTER — Ambulatory Visit (HOSPITAL_COMMUNITY)
Admission: EM | Admit: 2021-05-16 | Discharge: 2021-05-16 | Disposition: A | Discharge status: Home / Self Care | Payer: No Typology Code available for payment source

## 2021-05-16 ENCOUNTER — Encounter (HOSPITAL_COMMUNITY): Payer: Self-pay

## 2021-05-16 DIAGNOSIS — R58 Hemorrhage, not elsewhere classified: Secondary | ICD-10-CM | POA: Diagnosis not present

## 2021-05-16 DIAGNOSIS — R0789 Other chest pain: Secondary | ICD-10-CM | POA: Diagnosis not present
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
## 2021-05-16 NOTE — Discharge Instructions (Addendum)
You can take Ibuprofen and/or Tylenol as needed for pain.   You can apply ice to the area for comfort.   It can take a few weeks for the bruise to heal.   If the mass is still there after a few weeks (2-3), come back for re-evaluation.  Return or go to the Emergency Department if symptoms worsen or do not improve in the next few days.
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
## 2021-06-12 ENCOUNTER — Other Ambulatory Visit: Payer: Self-pay | Admitting: Internal Medicine

## 2021-06-12 DIAGNOSIS — I10 Essential (primary) hypertension: Secondary | ICD-10-CM

## 2021-06-12 DIAGNOSIS — F32A Depression, unspecified: Secondary | ICD-10-CM

## 2021-06-14 NOTE — Telephone Encounter (Signed)
Please advise on refills Pt has not located a PCP yet.

## 2021-06-15 ENCOUNTER — Telehealth: Payer: Self-pay

## 2021-06-15 NOTE — Telephone Encounter (Signed)
Called patient to follow up on refill request for Amlodipine, Hydrochlorothiazide, and Celexa. Patient states her insurance has just become active. Is currently looking for PCP to take over maintenance medication.  Will fill medication with one additional refill.  Leatrice Jewels, RMA

## 2021-06-20 ENCOUNTER — Other Ambulatory Visit (HOSPITAL_COMMUNITY): Payer: Self-pay

## 2021-06-20 MED ORDER — STUDY - REPRIEVE A5332 - PITAVASTATIN 4MG OR PLACEBO TABLET (PI-VAN DAM)
ORAL_TABLET | ORAL | 0 refills | Status: DC
  Filled 2021-06-20: qty 90, 90d supply, fill #0

## 2021-06-21 ENCOUNTER — Other Ambulatory Visit: Payer: Self-pay

## 2021-06-21 ENCOUNTER — Encounter (INDEPENDENT_AMBULATORY_CARE_PROVIDER_SITE_OTHER): Payer: PRIVATE HEALTH INSURANCE | Admitting: *Deleted

## 2021-06-21 DIAGNOSIS — Z006 Encounter for examination for normal comparison and control in clinical research program: Secondary | ICD-10-CM
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
## 2021-06-24 ENCOUNTER — Other Ambulatory Visit (HOSPITAL_COMMUNITY): Payer: Self-pay

## 2021-08-06 ENCOUNTER — Other Ambulatory Visit: Payer: Self-pay | Admitting: Internal Medicine

## 2021-08-06 DIAGNOSIS — I10 Essential (primary) hypertension: Secondary | ICD-10-CM

## 2021-08-06 DIAGNOSIS — F32A Depression, unspecified: Secondary | ICD-10-CM

## 2021-08-08 NOTE — Telephone Encounter (Signed)
Please advise on refill.

## 2021-08-23 ENCOUNTER — Ambulatory Visit: Payer: No Typology Code available for payment source

## 2021-08-24 ENCOUNTER — Ambulatory Visit: Payer: No Typology Code available for payment source

## 2021-09-27 ENCOUNTER — Other Ambulatory Visit: Payer: Self-pay | Admitting: Family Medicine

## 2021-09-27 DIAGNOSIS — Z1231 Encounter for screening mammogram for malignant neoplasm of breast: Secondary | ICD-10-CM

## 2021-10-07 ENCOUNTER — Other Ambulatory Visit: Payer: Self-pay | Admitting: Internal Medicine

## 2021-10-07 DIAGNOSIS — B20 Human immunodeficiency virus [HIV] disease: Secondary | ICD-10-CM

## 2021-10-07 NOTE — Telephone Encounter (Signed)
Appt 11/2

## 2021-10-12 ENCOUNTER — Ambulatory Visit (INDEPENDENT_AMBULATORY_CARE_PROVIDER_SITE_OTHER): Payer: No Typology Code available for payment source | Admitting: Internal Medicine

## 2021-10-12 ENCOUNTER — Other Ambulatory Visit: Payer: Self-pay

## 2021-10-12 ENCOUNTER — Encounter: Payer: Self-pay | Admitting: Internal Medicine

## 2021-10-12 DIAGNOSIS — F3342 Major depressive disorder, recurrent, in full remission: Secondary | ICD-10-CM | POA: Diagnosis not present

## 2021-10-12 DIAGNOSIS — B20 Human immunodeficiency virus [HIV] disease: Secondary | ICD-10-CM

## 2021-10-12 DIAGNOSIS — I10 Essential (primary) hypertension: Secondary | ICD-10-CM | POA: Diagnosis not present

## 2021-10-12 MED ORDER — GENVOYA 150-150-200-10 MG PO TABS: 1.0000 | ORAL_TABLET | Freq: Every day | ORAL | 11 refills | Status: DC

## 2021-10-12 NOTE — Assessment & Plan Note (Signed)
Her depression is in remission. 

## 2021-10-12 NOTE — Assessment & Plan Note (Signed)
Her infection has been under excellent, long-term control.  She will get repeat blood work today, continue [place] and follow-up in 1 year.
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
## 2021-10-12 NOTE — Assessment & Plan Note (Signed)
Her blood pressure is above goal.  I suggested that she obtain a blood pressure machine to help monitor her blood pressure at home.  She will follow-up with her PCP

## 2021-10-13 LAB — T-HELPER CELL (CD4) - (RCID CLINIC ONLY)
CD4 % Helper T Cell: 50 % (ref 33–65)
CD4 T Cell Abs: 1557 /uL (ref 400–1790)

## 2021-10-14 ENCOUNTER — Encounter: Payer: Self-pay | Admitting: Internal Medicine

## 2021-10-15 LAB — COMPREHENSIVE METABOLIC PANEL
AG Ratio: 1.3 (calc) (ref 1.0–2.5)
ALT: 15 U/L (ref 6–29)
AST: 21 U/L (ref 10–35)
Albumin: 4.4 g/dL (ref 3.6–5.1)
Alkaline phosphatase (APISO): 76 U/L (ref 37–153)
BUN: 15 mg/dL (ref 7–25)
CO2: 29 mmol/L (ref 20–32)
Calcium: 9.6 mg/dL (ref 8.6–10.4)
Chloride: 99 mmol/L (ref 98–110)
Creat: 0.72 mg/dL (ref 0.50–1.05)
Globulin: 3.3 g/dL (calc) (ref 1.9–3.7)
Glucose, Bld: 129 mg/dL — ABNORMAL HIGH (ref 65–99)
Potassium: 3.7 mmol/L (ref 3.5–5.3)
Sodium: 140 mmol/L (ref 135–146)
Total Bilirubin: 0.4 mg/dL (ref 0.2–1.2)
Total Protein: 7.7 g/dL (ref 6.1–8.1)

## 2021-10-15 LAB — CBC
HCT: 41.7 % (ref 35.0–45.0)
Hemoglobin: 14 g/dL (ref 11.7–15.5)
MCH: 28.8 pg (ref 27.0–33.0)
MCHC: 33.6 g/dL (ref 32.0–36.0)
MCV: 85.8 fL (ref 80.0–100.0)
MPV: 11 fL (ref 7.5–12.5)
Platelets: 302 10*3/uL (ref 140–400)
RBC: 4.86 10*6/uL (ref 3.80–5.10)
RDW: 14 % (ref 11.0–15.0)
WBC: 7.9 10*3/uL (ref 3.8–10.8)

## 2021-10-15 LAB — LIPID PANEL
Cholesterol: 190 mg/dL (ref <200)
HDL: 44 mg/dL — ABNORMAL LOW (ref >=50)
LDL Cholesterol (Calc): 109 mg/dL (calc) — ABNORMAL HIGH
Non-HDL Cholesterol (Calc): 146 mg/dL (calc) — ABNORMAL HIGH (ref <130)
Total CHOL/HDL Ratio: 4.3 (calc) (ref <5.0)
Triglycerides: 246 mg/dL — ABNORMAL HIGH (ref <150)

## 2021-10-15 LAB — RPR: RPR Ser Ql: NONREACTIVE

## 2021-10-15 LAB — HIV-1 RNA QUANT-NO REFLEX-BLD
HIV 1 RNA Quant: NOT DETECTED Copies/mL
HIV-1 RNA Quant, Log: NOT DETECTED Log cps/mL

## 2021-10-18 ENCOUNTER — Encounter: Payer: No Typology Code available for payment source | Admitting: *Deleted

## 2021-10-21 ENCOUNTER — Other Ambulatory Visit: Payer: Self-pay

## 2021-10-21 ENCOUNTER — Encounter (INDEPENDENT_AMBULATORY_CARE_PROVIDER_SITE_OTHER): Payer: No Typology Code available for payment source | Admitting: *Deleted

## 2021-10-21 VITALS — BP 132/90 | HR 72 | Temp 97.5°F | Wt 265.3 lb

## 2021-10-21 DIAGNOSIS — Z006 Encounter for examination for normal comparison and control in clinical research program: Secondary | ICD-10-CM

## 2021-10-21 NOTE — Research (Signed)
Angela Crane seen today for her Month 9 Reprieve visit. No new complaints. States that she did receive a Covid-19 and flu vaccine at her PCP office in October. Verbalized excellent adherence with her study medication. Denied any muscle aches or weakness. She will return in March for her next study visit.

## 2021-11-09 ENCOUNTER — Ambulatory Visit (INDEPENDENT_AMBULATORY_CARE_PROVIDER_SITE_OTHER): Payer: No Typology Code available for payment source

## 2021-11-09 ENCOUNTER — Other Ambulatory Visit: Payer: Self-pay

## 2021-11-09 ENCOUNTER — Ambulatory Visit (HOSPITAL_COMMUNITY)
Admission: EM | Admit: 2021-11-09 | Discharge: 2021-11-09 | Disposition: A | Discharge status: Home / Self Care | Payer: No Typology Code available for payment source

## 2021-11-09 ENCOUNTER — Encounter (HOSPITAL_COMMUNITY): Payer: Self-pay | Admitting: Emergency Medicine

## 2021-11-09 DIAGNOSIS — M25461 Effusion, right knee: Secondary | ICD-10-CM | POA: Diagnosis not present

## 2021-11-09 DIAGNOSIS — M25561 Pain in right knee: Secondary | ICD-10-CM | POA: Diagnosis not present

## 2021-11-09 MED ORDER — NAPROXEN 375 MG PO TABS: 375.0000 mg | ORAL_TABLET | Freq: Two times a day (BID) | ORAL | 0 refills | Status: DC

## 2021-11-09 MED ORDER — KETOROLAC TROMETHAMINE 30 MG/ML IJ SOLN
30.0000 mg | Freq: Once | INTRAMUSCULAR | Status: AC
  Administered 2021-11-09: 30 mg via INTRAMUSCULAR

## 2021-11-09 MED ORDER — KETOROLAC TROMETHAMINE 30 MG/ML IJ SOLN
INTRAMUSCULAR | Status: AC
  Filled 2021-11-09: qty 1

## 2021-11-09 NOTE — ED Triage Notes (Signed)
Pt presents with right knee pain and swelling Xs 1 week. Denies any fall or injury.

## 2021-11-09 NOTE — ED Provider Notes (Signed)
[place]    CSN: [id number] Arrival date & time: 11/09/21  1357      History   Chief Complaint Chief Complaint  Patient presents with   Knee Pain    Right    HPI Angela Crane is a 60 y.o. female.  She reports starting a new job activity 2 weeks ago where she has to stand and walk for 8 to 9 hours at a time.  Approximately 1 week ago she was in an airport lifting her suitcase when she felt an acute pain in her right knee.  Since then the pain has been getting progressively worse, and includes edema.  Difficult for patient to walk and bear weight due to pain.  She has been using Aleve with temporary improvement of symptoms however she has not taken any today.  Pain is more severe today.   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 No pertinent surgical history.  OB History   No obstetric history on file.      Home Medications    Prior to Admission medications   Medication Sig Start Date End Date Taking? Authorizing Provider  naproxen (NAPROSYN) 375 MG tablet Take 1 tablet (375 mg total) by mouth 2 (two) times daily. 11/09/21  Yes Carvel Getting, NP  amLODipine (NORVASC) 10 MG tablet TAKE 1 TABLET(10 MG) BY MOUTH DAILY 08/09/21   Michel Bickers, MD  citalopram (CELEXA) 20 MG tablet TAKE 2 TABLETS(40 MG) BY MOUTH DAILY 08/09/21   Michel Bickers, MD  diclofenac sodium (VOLTAREN) 1 % GEL Apply 2 g topically 4 (four) times  daily. 10/03/18   Michel Bickers, MD  elvitegravir-cobicistat-emtricitabine-tenofovir (GENVOYA) 150-150-200-10 MG TABS tablet Take 1 tablet by mouth daily with breakfast. 10/12/21   Michel Bickers, MD  hydrochlorothiazide (HYDRODIURIL) 25 MG tablet TAKE 1 TABLET(25 MG) BY MOUTH DAILY 08/09/21   Michel Bickers, MD  loratadine (CLARITIN) 10 MG tablet Take 10 mg by mouth daily.      [provider]  MOUNJARO 2.5 MG/0.5ML Pen SMARTSIG:2.5 Milligram(s) SUB-Q Once a Week 10/26/21   [provider]  naproxen sodium (ANAPROX) 220 MG tablet Take 220 mg by mouth 2 (two) times daily with a meal.    [provider]  Pitavastatin Calcium 4 MG TABS Take 1 tablet (4 mg total) by mouth daily. 03/26/15   Truman Hayward, MD  Study - REPRIEVE [phone] - pitavastatin 4 mg or placebo tablet [facility]) Take one tablet by mouth once daily.  [id number] D [id number] D ACTG S5681 06/15/21   Robert Bellow, PA-C    Family History Family History  Problem Relation Age of Onset   HIV Daughter    Diabetes Mother     Social History Social History   Tobacco Use   Smoking status: Never   Smokeless tobacco: Never  Vaping Use   Vaping Use: Never used  Substance Use Topics   Alcohol use: No   Drug use: No  Allergies   Sulfonamide derivatives   Review of Systems Review of Systems  Musculoskeletal:  Positive for gait problem and joint swelling.    Physical Exam Triage Vital Signs ED Triage Vitals  Enc Vitals Group     BP 11/09/21 1545 (!) 177/101     Pulse Rate 11/09/21 1545 76     Resp --      Temp 11/09/21 1545 98.6 F (37 C)     Temp Source 11/09/21 1545 Oral     SpO2 11/09/21 1545 96 %     Weight --      Height --      Head Circumference --      Peak Flow --      Pain Score 11/09/21 1544 8     Pain Loc --      Pain Edu? --      Excl. in [place]? --    No data found.  Updated Vital Signs BP (!) 177/101 (BP Location: Left Wrist)   Pulse 76   Temp 98.6 F (37 C)  (Oral)   LMP 08/20/2013   SpO2 96%   Visual Acuity Right Eye Distance:   Left Eye Distance:   Bilateral Distance:    Right Eye Near:   Left Eye Near:    Bilateral Near:     Physical Exam Constitutional:      General: She is not in acute distress.    Appearance: Normal appearance. She is obese.  Pulmonary:     Effort: Pulmonary effort is normal.  Musculoskeletal:     Right knee: Swelling and bony tenderness present. No deformity, erythema or ecchymosis. Normal range of motion. No LCL laxity, MCL laxity, ACL laxity or PCL laxity. Normal alignment and normal patellar mobility.  Neurological:     Mental Status: She is alert.     Gait: Gait abnormal.     Comments: Limping gait favors right lower extremity     UC Treatments / Results  Labs (all labs ordered are listed, but only abnormal results are displayed) Labs Reviewed - No data to display  EKG   Radiology DG Knee Complete 4 Views Right  Result Date: 11/09/2021 CLINICAL DATA:  Acute anterior/medial knee pain beginning 1 week ago and worsening. Swelling. EXAM: RIGHT KNEE - COMPLETE 4+ VIEW COMPARISON:  None. FINDINGS: No acute fracture or dislocation is identified. There is a large knee joint effusion. There may be mild lateral subluxation of the patella although definitive assessment is limited in the absence of a sunrise radiograph and this may instead be projectional. Femorotibial joint space widths are preserved. IMPRESSION: 1. Large knee joint effusion without acute osseous abnormality identified. 2. Possible lateral subluxation of the patella. Electronically Signed   By: Logan Bores M.D.   On: 11/09/2021 16:22    Procedures Procedures (including critical care time)  Medications Ordered in UC Medications  ketorolac (TORADOL) 30 MG/ML injection 30 mg (30 mg Intramuscular Given 11/09/21 1634)    Initial Impression / Assessment and Plan / UC Course  I have reviewed the triage vital signs and the nursing  notes.  Pertinent labs & imaging results that were available during my care of the patient were reviewed by me and considered in my medical decision making (see chart for details).    Knee x-ray shows large effusion and possible lateral patella subluxation.  Given shot of ketorolac here at urgent care, placed in Ace wrap.  Offered crutches but patient declined.  Given name of orthopedic practice  to follow-up with.  Prescribed prescription strength naproxen in place of over-the-counter Aleve.  Final Clinical Impressions(s) / UC Diagnoses   Final diagnoses:  Effusion of right knee     Discharge Instructions      Use the prescription naproxen instead of Aleve. Since you had a shot of ketorolac today at Urgent Care, start taking the naproxen tomorrow morning.   Follow up with the orthopedic clinic. The name of one clinic is below - if you prefer to go to a different orthopedic clinic, you may do so.      ED Prescriptions     Medication Sig Dispense Auth. Provider   naproxen (NAPROSYN) 375 MG tablet Take 1 tablet (375 mg total) by mouth 2 (two) times daily. 20 tablet Carvel Getting, NP      PDMP not reviewed this encounter.   Carvel Getting, NP 11/09/21 [phone]

## 2021-11-09 NOTE — Discharge Instructions (Signed)
Use the prescription naproxen instead of Aleve. Since you had a shot of ketorolac today at Urgent Care, start taking the naproxen tomorrow morning.   Follow up with the orthopedic clinic. The name of one clinic is below - if you prefer to go to a different orthopedic clinic, you may do so.

## 2021-12-20 ENCOUNTER — Other Ambulatory Visit: Payer: Self-pay

## 2021-12-20 ENCOUNTER — Ambulatory Visit (AMBULATORY_SURGERY_CENTER): Payer: No Typology Code available for payment source | Admitting: *Deleted

## 2021-12-20 VITALS — Ht 68.0 in | Wt 260.0 lb

## 2021-12-20 DIAGNOSIS — Z1211 Encounter for screening for malignant neoplasm of colon: Secondary | ICD-10-CM

## 2021-12-20 MED ORDER — SUTAB 1479-225-188 MG PO TABS: 1.0000 | ORAL_TABLET | ORAL | 0 refills | Status: DC

## 2021-12-20 NOTE — Progress Notes (Signed)
No egg or soy allergy known to patient  No issues known to pt with past sedation with any surgeries or procedures Patient denies ever being told they had issues or difficulty with intubation  No FH of Malignant Hyperthermia Pt is not on diet pills Pt is not on  home 02  Pt is not on blood thinners  Pt denies issues with constipation  No A fib or A flutter  Pt is fully vaccinated  for Covid   SUTAB Coupon given to pt in [facility] today , Code to Pharmacy and  NO PA's for preps discussed with pt In [facility] today  Discussed with pt there will be an out-of-pocket cost for prep and that varies from $0 to 70 +  dollars - pt verbalized understanding   Due to the COVID-19 pandemic we are asking patients to follow certain guidelines in PV and the [place]   Pt aware of COVID protocols and [facility] guidelines

## 2021-12-26 ENCOUNTER — Telehealth: Payer: Self-pay | Admitting: Internal Medicine

## 2021-12-26 MED ORDER — SUTAB 1479-225-188 MG PO TABS: 1.0000 | ORAL_TABLET | ORAL | 0 refills | Status: DC

## 2021-12-26 NOTE — Telephone Encounter (Signed)
Patient is at pharmacy picking up the sutab using the coupon price $60.

## 2021-12-26 NOTE — Telephone Encounter (Signed)
Inbound call from patient stating Sutab if over $200 and is requesting something more affordable to be sent to the pharmacy.  Please advise.

## 2021-12-28 ENCOUNTER — Encounter: Payer: Self-pay | Admitting: Internal Medicine

## 2022-01-02 ENCOUNTER — Ambulatory Visit (AMBULATORY_SURGERY_CENTER): Payer: PRIVATE HEALTH INSURANCE | Admitting: Internal Medicine

## 2022-01-02 ENCOUNTER — Other Ambulatory Visit: Payer: Self-pay

## 2022-01-02 ENCOUNTER — Encounter: Payer: Self-pay | Admitting: Internal Medicine

## 2022-01-02 VITALS — BP 107/68 | HR 86 | Temp 98.6°F | Resp 14 | Ht 68.0 in | Wt 255.0 lb

## 2022-01-02 DIAGNOSIS — D120 Benign neoplasm of cecum: Secondary | ICD-10-CM

## 2022-01-02 DIAGNOSIS — Z1211 Encounter for screening for malignant neoplasm of colon: Secondary | ICD-10-CM | POA: Diagnosis present

## 2022-01-02 MED ORDER — SODIUM CHLORIDE 0.9 % IV SOLN: 500.0000 mL | Freq: Once | INTRAVENOUS | Status: DC

## 2022-01-02 NOTE — Progress Notes (Signed)
PT taken to PACU. Monitors in place. VSS. Report given to RN. 

## 2022-01-02 NOTE — Progress Notes (Signed)
Pt's states no medical or surgical changes since previsit or office visit.  ° °VS DT °

## 2022-01-02 NOTE — Progress Notes (Signed)
HISTORY OF PRESENT ILLNESS:  Angela Crane is a 61 y.o. female who presents today for index screening colonoscopy.  No GI complaints  REVIEW OF SYSTEMS:  All non-GI ROS negative. Past Medical History:  Diagnosis Date   Anxiety    Depression    HIV (human immunodeficiency virus infection) ([place])    Hypertension    Menopausal and perimenopausal disorder     Past Surgical History:  Procedure Laterality Date   NO PAST SURGERIES     12/20/21    Social History Fedora Knisely  reports that she has never smoked. She has never used smokeless tobacco. She reports that she does not drink alcohol and does not use drugs.  family history includes Diabetes in her mother; HIV in her daughter.  Allergies  Allergen Reactions   Sulfonamide Derivatives     REACTION: dangerous inflamation       PHYSICAL EXAMINATION:  Vital signs: BP (!) 156/94    Pulse 94    Temp 98.6 F (37 C) (Temporal)    Resp (!) 8    Ht 5\' 8"  (1.727 m)    Wt 255 lb (115.7 kg)    LMP 08/20/2013    SpO2 99%    BMI 38.77 kg/m  General: Well-developed, well-nourished, no acute distress HEENT: Sclerae are anicteric, conjunctiva pink. Oral mucosa intact Lungs: Clear Heart: Regular Abdomen: soft, nontender, nondistended, no obvious ascites, no peritoneal signs, normal bowel sounds. No organomegaly. Extremities: No edema Psychiatric: alert and oriented x3. Cooperative     ASSESSMENT: 1.  Colon cancer screening  PLAN:  1.  Screening colonoscopy

## 2022-01-02 NOTE — Progress Notes (Signed)
Established Patient Office Visit  Subjective:  Patient ID: Angela Crane, female    DOB: July 02, 1961  Age: 61 y.o. MRN: [id number]  CC:  Chief Complaint  Patient presents with   Colonoscopy    SCREENING/ Angela Cipro, MD    HPI Angela Crane presents for index screening colonoscopy.  No GI complaints  Past Medical History:  Diagnosis Date   Anxiety    Depression    HIV (human immunodeficiency virus infection) ([place])    Hypertension    Menopausal and perimenopausal disorder     Past Surgical History:  Procedure Laterality Date   NO PAST SURGERIES     12/20/21    Family History  Problem Relation Age of Onset   Diabetes Mother    HIV Daughter    Colon cancer Neg Hx    Colon polyps Neg Hx    Esophageal cancer Neg Hx    Rectal cancer Neg Hx    Stomach cancer Neg Hx     Social History   Socioeconomic History   Marital status: Widowed    Spouse name: Not on file   Number of children: Not on file   Years of education: Not on file   Highest education level: Not on file  Occupational History   Not on file  Tobacco Use   Smoking status: Never   Smokeless tobacco: Never  Vaping Use   Vaping Use: Never used  Substance and Sexual Activity   Alcohol use: No   Drug use: No   Sexual activity: Not Currently    Comment: declined condoms  Other Topics Concern   Not on file  Social History Narrative   Not on file   Social Determinants of Health   Financial Resource Strain: Not on file  Food Insecurity: Not on file  Transportation Needs: Not on file  Physical Activity: Not on file  Stress: Not on file  Social Connections: Not on file  Intimate Partner Violence: Not on file    Outpatient Medications Prior to Visit  Medication Sig Dispense Refill   amLODipine (NORVASC) 10 MG tablet TAKE 1 TABLET(10 MG) BY MOUTH DAILY 30 tablet 2   citalopram (CELEXA) 20 MG tablet TAKE 2 TABLETS(40 MG) BY MOUTH DAILY 60 tablet 2   hydrochlorothiazide (HYDRODIURIL) 25 MG tablet  TAKE 1 TABLET(25 MG) BY MOUTH DAILY 30 tablet 2   diclofenac sodium (VOLTAREN) 1 % GEL Apply 2 g topically 4 (four) times daily. (Patient not taking: Reported on 12/20/2021) 100 g 2   loratadine (CLARITIN) 10 MG tablet Take 10 mg by mouth daily.   (Patient not taking: Reported on 12/20/2021)     meloxicam (MOBIC) 15 MG tablet Take 15 mg by mouth daily. (Patient not taking: Reported on 01/02/2022)     MOUNJARO 2.5 MG/0.5ML Pen SMARTSIG:2.5 Milligram(s) SUB-Q Once a Week     naproxen (NAPROSYN) 375 MG tablet Take 1 tablet (375 mg total) by mouth 2 (two) times daily. (Patient not taking: Reported on 12/20/2021) 20 tablet 0   naproxen sodium (ANAPROX) 220 MG tablet Take 220 mg by mouth 2 (two) times daily with a meal.     Pitavastatin Calcium 4 MG TABS Take 1 tablet (4 mg total) by mouth daily. 30 tablet 11   Study - REPRIEVE [phone] - pitavastatin 4 mg or placebo tablet ([facility]) Take one tablet by mouth once daily.  [id number] D ACTG A5332 90 tablet 0   valsartan (DIOVAN) 160 MG tablet Take 160 mg by mouth daily.  elvitegravir-cobicistat-emtricitabine-tenofovir (GENVOYA) 150-150-200-10 MG TABS tablet Take 1 tablet by mouth daily with breakfast. 30 tablet 11   No facility-administered medications prior to visit.    Allergies  Allergen Reactions   Sulfonamide Derivatives     REACTION: dangerous inflamation    ROS Review of Systems    Objective:    Physical Exam  BP 101/61    Pulse 94    Temp 98.6 F (37 C) (Temporal)    Resp 14    Ht 5\' 8"  (1.727 m)    Wt 255 lb (115.7 kg)    LMP 08/20/2013    SpO2 97%    BMI 38.77 kg/m  Wt Readings from Last 3 Encounters:  01/02/22 255 lb (115.7 kg)  12/20/21 260 lb (117.9 kg)  10/21/21 265 lb 5 oz (120.3 kg)   Vital signs: BP 101/61    Pulse 94    Temp 98.6 F (37 C) (Temporal)    Resp 14    Ht 5\' 8"  (1.727 m)    Wt 255 lb (115.7 kg)    LMP 08/20/2013    SpO2 97%    BMI 38.77 kg/m  General: Well-developed, well-nourished, no acute  distress HEENT: Sclerae are anicteric, conjunctiva pink. Oral mucosa intact Lungs: Clear Heart: Regular Abdomen: soft, nontender, nondistended, no obvious ascites, no peritoneal signs, normal bowel sounds. No organomegaly. Extremities: No edema Psychiatric: alert and oriented x3. Cooperative    Health Maintenance Due  Topic Date Due   Zoster Vaccines- Shingrix (1 of 2) Never done   COLONOSCOPY (Pts 45-91yrs Insurance coverage will need to be confirmed)  Never done   PAP SMEAR-Modifier  05/18/2018   MAMMOGRAM  05/31/2019   COVID-19 Vaccine (4 - Booster for Pfizer series) 12/02/2020   INFLUENZA VACCINE  07/11/2021    There are no preventive care reminders to display for this patient.  Lab Results  Component Value Date   TSH 1.543 10/06/2013   Lab Results  Component Value Date   WBC 7.9 10/12/2021   HGB 14.0 10/12/2021   HCT 41.7 10/12/2021   MCV 85.8 10/12/2021   PLT 302 10/12/2021   Lab Results  Component Value Date   NA 140 10/12/2021   K 3.7 10/12/2021   CO2 29 10/12/2021   GLUCOSE 129 (H) 10/12/2021   BUN 15 10/12/2021   CREATININE 0.72 10/12/2021   BILITOT 0.4 10/12/2021   ALKPHOS 65 08/09/2017   AST 21 10/12/2021   ALT 15 10/12/2021   PROT 7.7 10/12/2021   ALBUMIN 4.1 08/09/2017   CALCIUM 9.6 10/12/2021   Lab Results  Component Value Date   CHOL 190 10/12/2021   Lab Results  Component Value Date   HDL 44 (L) 10/12/2021   Lab Results  Component Value Date   LDLCALC 109 (H) 10/12/2021   Lab Results  Component Value Date   TRIG 246 (H) 10/12/2021   Lab Results  Component Value Date   CHOLHDL 4.3 10/12/2021   Lab Results  Component Value Date   HGBA1C 5.8 (H) 01/28/2015      Assessment & Plan:  1.  Colon cancer screening 2.  For screening colonoscopy Problem List Items Addressed This Visit   None Visit Diagnoses     Special screening for malignant neoplasms, colon    -  Primary   Relevant Medications   0.9 %  sodium chloride infusion    Other Relevant Orders   Diet NPO   Hypoglycemia/Hyperglycemia protocol   Emergency Medications   ECG and  pulse monitoring    Monitor NIBP   Notify physician   Monitor O2 SATs   NPO status   Vital signs   Monitor NIBP, ECG and PSA02   Notify physician   Discharge home with responsible adult.   Discharge instructions   Colonoscopy: verify informed consent   POCT CBG monitoring   POCT CBG monitoring   Surgical pathology ([facility])   Benign neoplasm of cecum       Relevant Orders   Surgical pathology ([facility])       Meds ordered this encounter  Medications   0.9 %  sodium chloride infusion    Follow-up: No follow-ups on file.    Scarlette Shorts, MD

## 2022-01-02 NOTE — Patient Instructions (Signed)
Handout on polyps given. ° °YOU HAD AN ENDOSCOPIC PROCEDURE TODAY AT THE [facility]:   Refer to the procedure report that was given to you for any specific questions about what was found during the examination.  If the procedure report does not answer your questions, please call your gastroenterologist to clarify.  If you requested that your care partner not be given the details of your procedure findings, then the procedure report has been included in a sealed envelope for you to review at your convenience later. ° °YOU SHOULD EXPECT: Some feelings of bloating in the abdomen. Passage of more gas than usual.  Walking can help get rid of the air that was put into your GI tract during the procedure and reduce the bloating. If you had a lower endoscopy (such as a colonoscopy or flexible sigmoidoscopy) you may notice spotting of blood in your stool or on the toilet paper. If you underwent a bowel prep for your procedure, you may not have a normal bowel movement for a few days. ° °Please Note:  You might notice some irritation and congestion in your nose or some drainage.  This is from the oxygen used during your procedure.  There is no need for concern and it should clear up in a day or so. ° °SYMPTOMS TO REPORT IMMEDIATELY: ° °Following lower endoscopy (colonoscopy or flexible sigmoidoscopy): ° Excessive amounts of blood in the stool ° Significant tenderness or worsening of abdominal pains ° Swelling of the abdomen that is new, acute ° Fever of 100°F or higher ° °For urgent or emergent issues, a gastroenterologist can be reached at any hour by calling [phone]. °Do not use MyChart messaging for urgent concerns.  ° ° °DIET:  We do recommend a small meal at first, but then you may proceed to your regular diet.  Drink plenty of fluids but you should avoid alcoholic beverages for 24 hours. ° °ACTIVITY:  You should plan to take it easy for the rest of today and you should NOT DRIVE or use heavy machinery  until tomorrow (because of the sedation medicines used during the test).   ° °FOLLOW UP: °Our staff will call the number listed on your records 48-72 hours following your procedure to check on you and address any questions or concerns that you may have regarding the information given to you following your procedure. If we do not reach you, we will leave a message.  We will attempt to reach you two times.  During this call, we will ask if you have developed any symptoms of COVID 19. If you develop any symptoms (ie: fever, flu-like symptoms, shortness of breath, cough etc.) before then, please call [phone].  If you test positive for Covid 19 in the 2 weeks post procedure, please call and report this information to us.   ° °If any biopsies were taken you will be contacted by phone or by letter within the next 1-3 weeks.  Please call us at [phone] if you have not heard about the biopsies in 3 weeks.  ° ° °SIGNATURES/CONFIDENTIALITY: °You and/or your care partner have signed paperwork which will be entered into your electronic medical record.  These signatures attest to the fact that that the information above on your After Visit Summary has been reviewed and is understood.  Full responsibility of the confidentiality of this discharge information lies with you and/or your care-partner.  °

## 2022-01-02 NOTE — Op Note (Signed)
[name] Procedure Date: 01/02/2022 9:22 AM MRN: [id number] Endoscopist: Docia Chuck. Henrene Pastor , MD Age: 61 Referring MD:  Date of Birth: 29-May-1961 Gender: Female Account #: [id number] Procedure:                Colonoscopy with cold snare polypectomy x 1 Indications:              Screening for colorectal malignant neoplasm. Index                            exam Medicines:                Monitored Anesthesia Care Procedure:                Pre-Anesthesia Assessment:                           - Prior to the procedure, a History and Physical                            was performed, and patient medications and                            allergies were reviewed. The patient's tolerance of                            previous anesthesia was also reviewed. The risks                            and benefits of the procedure and the sedation                            options and risks were discussed with the patient.                            All questions were answered, and informed consent                            was obtained. Prior Anticoagulants: The patient has                            taken no previous anticoagulant or antiplatelet                            agents. ASA Grade Assessment: II - A patient with                            mild systemic disease. After reviewing the risks                            and benefits, the patient was deemed in                            satisfactory condition to undergo the procedure.  After obtaining informed consent, the colonoscope                            was passed under direct vision. Throughout the                            procedure, the patient's blood pressure, pulse, and                            oxygen saturations were monitored continuously. The                            Olympus CF-HQ190L (Serial# [id number]) Colonoscope was                            introduced through the  anus and advanced to the the                            cecum, identified by appendiceal orifice and                            ileocecal valve. The ileocecal valve, appendiceal                            orifice, and rectum were photographed. The quality                            of the bowel preparation was good. The colonoscopy                            was performed without difficulty. The patient                            tolerated the procedure well. The bowel preparation                            used was SUPREP via split dose instruction. Scope In: 9:33:01 AM Scope Out: 9:52:34 AM Scope Withdrawal Time: 0 hours 15 minutes 30 seconds  Total Procedure Duration: 0 hours 19 minutes 33 seconds  Findings:                 A 3 mm polyp was found in the cecum. The polyp was                            removed with a cold snare. Resection and retrieval                            were complete.                           The exam was otherwise without abnormality on                            direct and retroflexion views. Internal hemorrhoids  present. Complications:            No immediate complications. Estimated blood loss:                            None. Estimated Blood Loss:     Estimated blood loss: none. Impression:               - One 3 mm polyp in the cecum, removed with a cold                            snare. Resected and retrieved.                           - The examination was otherwise normal on direct                            and retroflexion views. Recommendation:           - Repeat colonoscopy in 7-10 years for surveillance.                           - Patient has a contact number available for                            emergencies. The signs and symptoms of potential                            delayed complications were discussed with the                            patient. Return to normal activities tomorrow.                             Written discharge instructions were provided to the                            patient.                           - Resume previous diet.                           - Continue present medications.                           - Await pathology results. Docia Chuck. Henrene Pastor, MD 01/02/2022 9:59:41 AM This report has been signed electronically.

## 2022-01-04 ENCOUNTER — Telehealth: Payer: Self-pay

## 2022-01-04 NOTE — Telephone Encounter (Signed)
°  Follow up Call-  Call back number 01/02/2022  Post procedure Call Back phone  # [phone]  Permission to leave phone message Yes  Some recent data might be hidden     Patient questions:  Do you have a fever, pain , or abdominal swelling? No. Pain Score  0 *  Have you tolerated food without any problems? Yes.    Have you been able to return to your normal activities? Yes.    Do you have any questions about your discharge instructions: Diet   No. Medications  No. Follow up visit  No.  Do you have questions or concerns about your Care? No.  Actions: * If pain score is 4 or above: No action needed, pain <4.  Have you developed a fever since your procedure? no  2.   Have you had an respiratory symptoms (SOB or cough) since your procedure? no  3.   Have you tested positive for COVID 19 since your procedure no  4.   Have you had any family members/close contacts diagnosed with the COVID 19 since your procedure?  no   If yes to any of these questions please route to Joylene John, RN and Joella Prince, RN

## 2022-01-05 ENCOUNTER — Encounter: Payer: Self-pay | Admitting: Internal Medicine

## 2022-02-16 ENCOUNTER — Encounter (INDEPENDENT_AMBULATORY_CARE_PROVIDER_SITE_OTHER): Payer: No Typology Code available for payment source | Admitting: *Deleted

## 2022-02-16 ENCOUNTER — Other Ambulatory Visit: Payer: Self-pay

## 2022-02-16 VITALS — BP 134/90 | HR 76 | Temp 97.9°F | Wt 257.4 lb

## 2022-02-16 DIAGNOSIS — Z006 Encounter for examination for normal comparison and control in clinical research program: Secondary | ICD-10-CM
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
## 2022-04-12 ENCOUNTER — Other Ambulatory Visit (HOSPITAL_COMMUNITY): Payer: Self-pay

## 2022-06-02 ENCOUNTER — Emergency Department (HOSPITAL_COMMUNITY): Payer: 59

## 2022-06-02 ENCOUNTER — Encounter (HOSPITAL_COMMUNITY): Payer: Self-pay | Admitting: Emergency Medicine

## 2022-06-02 ENCOUNTER — Emergency Department (HOSPITAL_COMMUNITY)
Admission: EM | Admit: 2022-06-02 | Discharge: 2022-06-02 | Disposition: A | Discharge status: Home / Self Care | Payer: 59 | Attending: Emergency Medicine | Admitting: Emergency Medicine

## 2022-06-02 DIAGNOSIS — Z79899 Other long term (current) drug therapy: Secondary | ICD-10-CM | POA: Insufficient documentation

## 2022-06-02 DIAGNOSIS — S2002XA Contusion of left breast, initial encounter: Secondary | ICD-10-CM | POA: Diagnosis not present

## 2022-06-02 DIAGNOSIS — I10 Essential (primary) hypertension: Secondary | ICD-10-CM | POA: Insufficient documentation

## 2022-06-02 DIAGNOSIS — S2001XA Contusion of right breast, initial encounter: Secondary | ICD-10-CM | POA: Insufficient documentation

## 2022-06-02 DIAGNOSIS — Z21 Asymptomatic human immunodeficiency virus [HIV] infection status: Secondary | ICD-10-CM | POA: Diagnosis not present

## 2022-06-02 DIAGNOSIS — S20109A Unspecified superficial injuries of breast, unspecified breast, initial encounter: Secondary | ICD-10-CM | POA: Diagnosis present

## 2022-06-02 DIAGNOSIS — T148XXA Other injury of unspecified body region, initial encounter: Secondary | ICD-10-CM

## 2022-06-02 LAB — CBC WITH DIFFERENTIAL/PLATELET
Abs Immature Granulocytes: 0.07 10*3/uL (ref 0.00–0.07)
Basophils Absolute: 0.1 10*3/uL (ref 0.0–0.1)
Basophils Relative: 0 %
Eosinophils Absolute: 0.1 10*3/uL (ref 0.0–0.5)
Eosinophils Relative: 1 %
HCT: 40.8 % (ref 36.0–46.0)
Hemoglobin: 14 g/dL (ref 12.0–15.0)
Immature Granulocytes: 0 %
Lymphocytes Relative: 14 %
Lymphs Abs: 2.4 10*3/uL (ref 0.7–4.0)
MCH: 29.7 pg (ref 26.0–34.0)
MCHC: 34.3 g/dL (ref 30.0–36.0)
MCV: 86.6 fL (ref 80.0–100.0)
Monocytes Absolute: 0.7 10*3/uL (ref 0.1–1.0)
Monocytes Relative: 4 %
Neutro Abs: 13.5 10*3/uL — ABNORMAL HIGH (ref 1.7–7.7)
Neutrophils Relative %: 81 %
Platelets: 298 10*3/uL (ref 150–400)
RBC: 4.71 MIL/uL (ref 3.87–5.11)
RDW: 15.1 % (ref 11.5–15.5)
WBC: 16.8 10*3/uL — ABNORMAL HIGH (ref 4.0–10.5)
nRBC: 0 % (ref 0.0–0.2)

## 2022-06-02 LAB — BASIC METABOLIC PANEL
Anion gap: 10 (ref 5–15)
BUN: 13 mg/dL (ref 8–23)
CO2: 23 mmol/L (ref 22–32)
Calcium: 9.5 mg/dL (ref 8.9–10.3)
Chloride: 105 mmol/L (ref 98–111)
Creatinine, Ser: 0.96 mg/dL (ref 0.44–1.00)
GFR, Estimated: >60 mL/min (ref >60)
Glucose, Bld: 165 mg/dL — ABNORMAL HIGH (ref 70–99)
Potassium: 3.7 mmol/L (ref 3.5–5.1)
Sodium: 138 mmol/L (ref 135–145)

## 2022-06-02 MED ORDER — OXYCODONE-ACETAMINOPHEN 5-325 MG PO TABS
1.0000 | ORAL_TABLET | Freq: Once | ORAL | Status: AC
Start: 2022-06-02 — End: 2022-06-02
  Administered 2022-06-02: 1 via ORAL
  Filled 2022-06-02: qty 1

## 2022-06-02 MED ORDER — IOHEXOL 300 MG/ML  SOLN
75.0000 mL | Freq: Once | INTRAMUSCULAR | Status: AC | PRN
  Administered 2022-06-02: 75 mL via INTRAVENOUS

## 2022-06-07 ENCOUNTER — Emergency Department (HOSPITAL_COMMUNITY)
Admission: EM | Admit: 2022-06-07 | Discharge: 2022-06-07 | Disposition: A | Discharge status: Home / Self Care | Payer: 59 | Attending: Emergency Medicine | Admitting: Emergency Medicine

## 2022-06-07 DIAGNOSIS — S299XXD Unspecified injury of thorax, subsequent encounter: Secondary | ICD-10-CM | POA: Diagnosis present

## 2022-06-07 DIAGNOSIS — R911 Solitary pulmonary nodule: Secondary | ICD-10-CM | POA: Diagnosis not present

## 2022-06-07 DIAGNOSIS — R918 Other nonspecific abnormal finding of lung field: Secondary | ICD-10-CM

## 2022-06-07 DIAGNOSIS — T07XXXA Unspecified multiple injuries, initial encounter: Secondary | ICD-10-CM

## 2022-06-07 DIAGNOSIS — S2002XD Contusion of left breast, subsequent encounter: Secondary | ICD-10-CM | POA: Diagnosis not present

## 2022-06-07 MED ORDER — METHOCARBAMOL 500 MG PO TABS: 500.0000 mg | ORAL_TABLET | Freq: Three times a day (TID) | ORAL | 0 refills | Status: DC | PRN
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
## 2022-06-13 ENCOUNTER — Encounter (HOSPITAL_COMMUNITY): Payer: Self-pay

## 2022-06-13 ENCOUNTER — Emergency Department (HOSPITAL_COMMUNITY)
Admission: EM | Admit: 2022-06-13 | Discharge: 2022-06-13 | Disposition: A | Discharge status: Home / Self Care | Payer: 59 | Attending: Emergency Medicine | Admitting: Emergency Medicine

## 2022-06-13 DIAGNOSIS — S2001XA Contusion of right breast, initial encounter: Secondary | ICD-10-CM | POA: Insufficient documentation

## 2022-06-13 DIAGNOSIS — S20102A Unspecified superficial injuries of breast, left breast, initial encounter: Secondary | ICD-10-CM | POA: Diagnosis present

## 2022-06-13 DIAGNOSIS — Z09 Encounter for follow-up examination after completed treatment for conditions other than malignant neoplasm: Secondary | ICD-10-CM

## 2022-06-13 DIAGNOSIS — I10 Essential (primary) hypertension: Secondary | ICD-10-CM | POA: Diagnosis not present

## 2022-06-13 DIAGNOSIS — Z21 Asymptomatic human immunodeficiency virus [HIV] infection status: Secondary | ICD-10-CM | POA: Diagnosis not present

## 2022-06-13 DIAGNOSIS — S2002XA Contusion of left breast, initial encounter: Secondary | ICD-10-CM | POA: Diagnosis not present

## 2022-06-13 DIAGNOSIS — Y92410 Unspecified street and highway as the place of occurrence of the external cause: Secondary | ICD-10-CM | POA: Diagnosis not present

## 2022-06-13 DIAGNOSIS — X500XXA Overexertion from strenuous movement or load, initial encounter: Secondary | ICD-10-CM | POA: Diagnosis not present
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
## 2022-06-13 NOTE — Discharge Instructions (Signed)
You were seen today for re-check of your injuries since you are feeling better. Because you report that you are feeling much better with less pain as well as can lift with ease, I have attached a new note saying that you can return to work. Please make sure to follow up with your scheduled PCP appointment coming. Please continue to take the medications prescribed to you. If you have any concern, new or worsening symptoms, please return to the ED.

## 2022-06-13 NOTE — ED Notes (Signed)
This tech assisted Ovid Curd PA as a chaperone for patient. Per Ovid Curd, patient completed weight test with two chairs, was able to lift both chairs to chest level x2 times. Patient stated that she did not experience pain, discomfort, or any other issues during this activity.
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
## 2022-06-15 ENCOUNTER — Other Ambulatory Visit: Payer: Self-pay

## 2022-06-15 ENCOUNTER — Other Ambulatory Visit: Payer: Self-pay | Admitting: *Deleted

## 2022-06-15 ENCOUNTER — Encounter (INDEPENDENT_AMBULATORY_CARE_PROVIDER_SITE_OTHER): Payer: 59 | Admitting: *Deleted

## 2022-06-15 ENCOUNTER — Other Ambulatory Visit (HOSPITAL_COMMUNITY): Payer: Self-pay

## 2022-06-15 VITALS — BP 133/86 | HR 71 | Temp 97.9°F | Wt 261.2 lb

## 2022-06-15 DIAGNOSIS — Z006 Encounter for examination for normal comparison and control in clinical research program: Secondary | ICD-10-CM
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
## 2022-06-18 LAB — HIV-1 RNA QUANT-NO REFLEX-BLD
HIV 1 RNA Quant: NOT DETECTED Copies/mL
HIV-1 RNA Quant, Log: NOT DETECTED Log cps/mL

## 2022-06-23 ENCOUNTER — Other Ambulatory Visit: Payer: Self-pay | Admitting: Family Medicine

## 2022-06-23 ENCOUNTER — Ambulatory Visit
Admission: RE | Admit: 2022-06-23 | Discharge: 2022-06-23 | Disposition: A | Discharge status: Home / Self Care | Payer: Self-pay | Source: Ambulatory Visit | Attending: Family Medicine | Admitting: Family Medicine

## 2022-07-05 ENCOUNTER — Encounter: Payer: Self-pay | Admitting: Internal Medicine

## 2022-10-09 ENCOUNTER — Other Ambulatory Visit: Payer: Self-pay | Admitting: Family Medicine

## 2022-10-09 DIAGNOSIS — Z1231 Encounter for screening mammogram for malignant neoplasm of breast: Secondary | ICD-10-CM

## 2022-10-10 ENCOUNTER — Other Ambulatory Visit: Payer: Self-pay | Admitting: Internal Medicine

## 2022-10-10 DIAGNOSIS — B20 Human immunodeficiency virus [HIV] disease: Secondary | ICD-10-CM

## 2022-11-07 ENCOUNTER — Ambulatory Visit
Admission: RE | Admit: 2022-11-07 | Discharge: 2022-11-07 | Disposition: A | Discharge status: Home / Self Care | Payer: PRIVATE HEALTH INSURANCE | Source: Ambulatory Visit | Attending: Family Medicine | Admitting: Family Medicine

## 2022-11-07 ENCOUNTER — Other Ambulatory Visit: Payer: Self-pay

## 2022-11-07 DIAGNOSIS — Z113 Encounter for screening for infections with a predominantly sexual mode of transmission: Secondary | ICD-10-CM

## 2022-11-07 DIAGNOSIS — Z1231 Encounter for screening mammogram for malignant neoplasm of breast: Secondary | ICD-10-CM

## 2022-11-07 DIAGNOSIS — B20 Human immunodeficiency virus [HIV] disease: Secondary | ICD-10-CM

## 2022-11-07 DIAGNOSIS — Z79899 Other long term (current) drug therapy: Secondary | ICD-10-CM

## 2022-11-08 ENCOUNTER — Other Ambulatory Visit: Payer: Self-pay | Admitting: Family Medicine

## 2022-11-08 ENCOUNTER — Other Ambulatory Visit: Payer: Self-pay

## 2022-11-08 ENCOUNTER — Other Ambulatory Visit: Payer: PRIVATE HEALTH INSURANCE

## 2022-11-08 DIAGNOSIS — Z113 Encounter for screening for infections with a predominantly sexual mode of transmission: Secondary | ICD-10-CM

## 2022-11-08 DIAGNOSIS — N63 Unspecified lump in unspecified breast: Secondary | ICD-10-CM

## 2022-11-08 DIAGNOSIS — Z79899 Other long term (current) drug therapy: Secondary | ICD-10-CM

## 2022-11-08 DIAGNOSIS — B20 Human immunodeficiency virus [HIV] disease: Secondary | ICD-10-CM

## 2022-11-09 LAB — T-HELPER CELL (CD4) - (RCID CLINIC ONLY)
CD4 % Helper T Cell: 55 % (ref 33–65)
CD4 T Cell Abs: 2135 /uL — ABNORMAL HIGH (ref 400–1790)

## 2022-11-10 ENCOUNTER — Other Ambulatory Visit: Payer: Self-pay | Admitting: Internal Medicine

## 2022-11-10 DIAGNOSIS — B20 Human immunodeficiency virus [HIV] disease: Secondary | ICD-10-CM

## 2022-11-11 LAB — CBC WITH DIFFERENTIAL/PLATELET
Absolute Monocytes: 382 cells/uL (ref 200–950)
Basophils Absolute: 36 cells/uL (ref 0–200)
Basophils Relative: 0.4 %
Eosinophils Absolute: 218 cells/uL (ref 15–500)
Eosinophils Relative: 2.4 %
HCT: 37.2 % (ref 35.0–45.0)
Hemoglobin: 12.9 g/dL (ref 11.7–15.5)
Lymphs Abs: 4477 cells/uL — ABNORMAL HIGH (ref 850–3900)
MCH: 28.9 pg (ref 27.0–33.0)
MCHC: 34.7 g/dL (ref 32.0–36.0)
MCV: 83.4 fL (ref 80.0–100.0)
MPV: 10.8 fL (ref 7.5–12.5)
Monocytes Relative: 4.2 %
Neutro Abs: 3986 cells/uL (ref 1500–7800)
Neutrophils Relative %: 43.8 %
Platelets: 329 10*3/uL (ref 140–400)
RBC: 4.46 10*6/uL (ref 3.80–5.10)
RDW: 14.4 % (ref 11.0–15.0)
Total Lymphocyte: 49.2 %
WBC: 9.1 10*3/uL (ref 3.8–10.8)

## 2022-11-11 LAB — COMPLETE METABOLIC PANEL WITH GFR
AG Ratio: 1.3 (calc) (ref 1.0–2.5)
ALT: 12 U/L (ref 6–29)
AST: 18 U/L (ref 10–35)
Albumin: 4 g/dL (ref 3.6–5.1)
Alkaline phosphatase (APISO): 64 U/L (ref 37–153)
BUN: 13 mg/dL (ref 7–25)
CO2: 27 mmol/L (ref 20–32)
Calcium: 9.5 mg/dL (ref 8.6–10.4)
Chloride: 105 mmol/L (ref 98–110)
Creat: 0.81 mg/dL (ref 0.50–1.05)
Globulin: 3.1 g/dL (calc) (ref 1.9–3.7)
Glucose, Bld: 102 mg/dL — ABNORMAL HIGH (ref 65–99)
Potassium: 3.8 mmol/L (ref 3.5–5.3)
Sodium: 143 mmol/L (ref 135–146)
Total Bilirubin: 0.3 mg/dL (ref 0.2–1.2)
Total Protein: 7.1 g/dL (ref 6.1–8.1)
eGFR: 82 mL/min/{1.73_m2} (ref >=60)

## 2022-11-11 LAB — LIPID PANEL
Cholesterol: 143 mg/dL (ref <200)
HDL: 45 mg/dL — ABNORMAL LOW (ref >=50)
LDL Cholesterol (Calc): 75 mg/dL (calc)
Non-HDL Cholesterol (Calc): 98 mg/dL (calc) (ref <130)
Total CHOL/HDL Ratio: 3.2 (calc) (ref <5.0)
Triglycerides: 142 mg/dL (ref <150)

## 2022-11-11 LAB — HIV-1 RNA QUANT-NO REFLEX-BLD
HIV 1 RNA Quant: 22 Copies/mL — ABNORMAL HIGH
HIV-1 RNA Quant, Log: 1.33 Log cps/mL — ABNORMAL HIGH

## 2022-11-11 LAB — RPR: RPR Ser Ql: NONREACTIVE

## 2022-11-22 ENCOUNTER — Telehealth: Payer: Self-pay

## 2022-11-22 ENCOUNTER — Other Ambulatory Visit: Payer: Self-pay

## 2022-11-22 ENCOUNTER — Encounter: Payer: Self-pay | Admitting: Internal Medicine

## 2022-11-22 ENCOUNTER — Ambulatory Visit (INDEPENDENT_AMBULATORY_CARE_PROVIDER_SITE_OTHER): Payer: PRIVATE HEALTH INSURANCE | Admitting: Internal Medicine

## 2022-11-22 VITALS — BP 136/89 | HR 69 | Temp 97.9°F | Ht 68.0 in | Wt 245.0 lb

## 2022-11-22 DIAGNOSIS — B20 Human immunodeficiency virus [HIV] disease: Secondary | ICD-10-CM

## 2022-11-22 MED ORDER — GENVOYA 150-150-200-10 MG PO TABS: 1.0000 | ORAL_TABLET | Freq: Every day | ORAL | 11 refills | Status: DC

## 2022-11-22 NOTE — Telephone Encounter (Signed)
Called patient to see if she would be able to make it to today's appointment, no answer and unable to leave voicemail.   Beryle Flock, RN
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
## 2022-11-22 NOTE — Assessment & Plan Note (Signed)
Her infection has been under excellent, long-term control.  She will continue Genvoya and follow-up after lab work in 1 year.

## 2023-01-01 ENCOUNTER — Other Ambulatory Visit: Payer: PRIVATE HEALTH INSURANCE

## 2023-01-01 DIAGNOSIS — I10 Essential (primary) hypertension: Secondary | ICD-10-CM | POA: Diagnosis not present

## 2023-01-01 DIAGNOSIS — Z23 Encounter for immunization: Secondary | ICD-10-CM | POA: Diagnosis not present

## 2023-01-01 DIAGNOSIS — E1143 Type 2 diabetes mellitus with diabetic autonomic (poly)neuropathy: Secondary | ICD-10-CM | POA: Diagnosis not present

## 2023-01-01 DIAGNOSIS — E1165 Type 2 diabetes mellitus with hyperglycemia: Secondary | ICD-10-CM | POA: Diagnosis not present

## 2023-01-01 DIAGNOSIS — E782 Mixed hyperlipidemia: Secondary | ICD-10-CM | POA: Diagnosis not present

## 2023-01-22 DIAGNOSIS — I10 Essential (primary) hypertension: Secondary | ICD-10-CM | POA: Diagnosis not present

## 2023-01-22 DIAGNOSIS — E1165 Type 2 diabetes mellitus with hyperglycemia: Secondary | ICD-10-CM | POA: Diagnosis not present

## 2023-01-22 DIAGNOSIS — S2002XA Contusion of left breast, initial encounter: Secondary | ICD-10-CM | POA: Diagnosis not present

## 2023-02-19 DIAGNOSIS — I10 Essential (primary) hypertension: Secondary | ICD-10-CM | POA: Diagnosis not present

## 2023-02-19 DIAGNOSIS — E1121 Type 2 diabetes mellitus with diabetic nephropathy: Secondary | ICD-10-CM | POA: Diagnosis not present

## 2023-02-19 DIAGNOSIS — E1165 Type 2 diabetes mellitus with hyperglycemia: Secondary | ICD-10-CM | POA: Diagnosis not present

## 2023-03-27 DIAGNOSIS — Z23 Encounter for immunization: Secondary | ICD-10-CM | POA: Diagnosis not present

## 2023-04-30 DIAGNOSIS — I10 Essential (primary) hypertension: Secondary | ICD-10-CM | POA: Diagnosis not present

## 2023-04-30 DIAGNOSIS — E1165 Type 2 diabetes mellitus with hyperglycemia: Secondary | ICD-10-CM | POA: Diagnosis not present

## 2023-04-30 DIAGNOSIS — N641 Fat necrosis of breast: Secondary | ICD-10-CM | POA: Diagnosis not present

## 2023-05-23 NOTE — Progress Notes (Signed)
The 10-year ASCVD risk score (Arnett DK, et al., 2019) is: 7.3%   Values used to calculate the score:     Age: 62 years     Sex: Female     Is Non-Hispanic African American: Yes     Diabetic: No     Tobacco smoker: No     Systolic Blood Pressure: 136 mmHg     Is BP treated: Yes     HDL Cholesterol: 45 mg/dL     Total Cholesterol: 143 mg/dL  Sandie Ano, RN

## 2023-05-29 ENCOUNTER — Other Ambulatory Visit: Payer: Self-pay

## 2023-05-29 DIAGNOSIS — B20 Human immunodeficiency virus [HIV] disease: Secondary | ICD-10-CM

## 2023-05-29 DIAGNOSIS — Z113 Encounter for screening for infections with a predominantly sexual mode of transmission: Secondary | ICD-10-CM

## 2023-05-29 LAB — CBC WITH DIFFERENTIAL/PLATELET
Basophils Absolute: 55 cells/uL (ref 0–200)
Eosinophils Relative: 1.9 %
MCH: 28.8 pg (ref 27.0–33.0)
Neutro Abs: 3686 cells/uL (ref 1500–7800)
Platelets: 300 10*3/uL (ref 140–400)
RDW: 15 % (ref 11.0–15.0)
Total Lymphocyte: 51.6 %

## 2023-05-29 LAB — COMPLETE METABOLIC PANEL WITH GFR
ALT: 12 U/L (ref 6–29)
AST: 17 U/L (ref 10–35)
Alkaline phosphatase (APISO): 72 U/L (ref 37–153)
Calcium: 9.4 mg/dL (ref 8.6–10.4)
Creat: 0.82 mg/dL (ref 0.50–1.05)
Potassium: 4.7 mmol/L (ref 3.5–5.3)
eGFR: 81 mL/min/{1.73_m2} (ref >=60)

## 2023-05-29 NOTE — Addendum Note (Signed)
Addended by: Harley Alto on: 05/29/2023 08:58 AM   Modules accepted: Orders

## 2023-05-31 LAB — COMPLETE METABOLIC PANEL WITH GFR
AG Ratio: 1.3 (calc) (ref 1.0–2.5)
Albumin: 3.8 g/dL (ref 3.6–5.1)
BUN: 17 mg/dL (ref 7–25)
CO2: 31 mmol/L (ref 20–32)
Chloride: 105 mmol/L (ref 98–110)
Globulin: 3 g/dL (calc) (ref 1.9–3.7)
Glucose, Bld: 99 mg/dL (ref 65–99)
Sodium: 141 mmol/L (ref 135–146)
Total Bilirubin: 0.3 mg/dL (ref 0.2–1.2)
Total Protein: 6.8 g/dL (ref 6.1–8.1)

## 2023-05-31 LAB — HIV-1 RNA QUANT-NO REFLEX-BLD
HIV 1 RNA Quant: <20 Copies/mL — ABNORMAL HIGH
HIV-1 RNA Quant, Log: <1.3 Log cps/mL — ABNORMAL HIGH

## 2023-05-31 LAB — CBC WITH DIFFERENTIAL/PLATELET
Absolute Monocytes: 491 cells/uL (ref 200–950)
Basophils Relative: 0.6 %
Eosinophils Absolute: 173 cells/uL (ref 15–500)
HCT: 38.9 % (ref 35.0–45.0)
Hemoglobin: 13.2 g/dL (ref 11.7–15.5)
Lymphs Abs: 4696 cells/uL — ABNORMAL HIGH (ref 850–3900)
MCHC: 33.9 g/dL (ref 32.0–36.0)
MCV: 84.9 fL (ref 80.0–100.0)
MPV: 10.7 fL (ref 7.5–12.5)
Monocytes Relative: 5.4 %
Neutrophils Relative %: 40.5 %
RBC: 4.58 10*6/uL (ref 3.80–5.10)
WBC: 9.1 10*3/uL (ref 3.8–10.8)

## 2023-05-31 LAB — LIPID PANEL
Cholesterol: 208 mg/dL — ABNORMAL HIGH (ref <200)
HDL: 47 mg/dL — ABNORMAL LOW (ref >=50)
LDL Cholesterol (Calc): 125 mg/dL (calc) — ABNORMAL HIGH
Non-HDL Cholesterol (Calc): 161 mg/dL (calc) — ABNORMAL HIGH (ref <130)
Total CHOL/HDL Ratio: 4.4 (calc) (ref <5.0)
Triglycerides: 221 mg/dL — ABNORMAL HIGH (ref <150)

## 2023-05-31 LAB — RPR: RPR Ser Ql: NONREACTIVE

## 2023-05-31 LAB — T-HELPER CELLS (CD4) COUNT (NOT AT ARMC)
Absolute CD4: 2558 cells/uL — ABNORMAL HIGH (ref 490–1740)
CD4 T Helper %: 55 % (ref 30–61)
Total lymphocyte count: 4625 cells/uL — ABNORMAL HIGH (ref 850–3900)

## 2023-06-11 ENCOUNTER — Encounter: Payer: Self-pay | Admitting: Infectious Disease

## 2023-06-11 DIAGNOSIS — E785 Hyperlipidemia, unspecified: Secondary | ICD-10-CM

## 2023-06-11 HISTORY — DX: Hyperlipidemia, unspecified: E78.5

## 2023-06-11 NOTE — Progress Notes (Unsigned)
Subjective:  Chief complaint follow-up for HIV disease on medications  Patient ID: Angela Crane, female    DOB: 25-Jan-1961, 62 y.o.   MRN: [id number]  HPI Angela Crane is a lovely 62 year old woman, regionally from the [place].  She has perfectly controlled HIV for well over 16+ years.  She was [place] and [place] and more recently [place].  I would like to get her on a regimen without the booster of cobicistat   Past Medical History:  Diagnosis Date   Anxiety    Depression    HIV (human immunodeficiency virus infection) (HCC)    Hyperlipidemia 06/11/2023   Hypertension    Menopausal and perimenopausal disorder     Past Surgical History:  Procedure Laterality Date   NO PAST SURGERIES     12/20/21    Family History  Problem Relation Age of Onset   Diabetes Mother    HIV Daughter    Colon cancer Neg Hx    Colon polyps Neg Hx    Esophageal cancer Neg Hx    Rectal cancer Neg Hx    Stomach cancer Neg Hx       Social History   Socioeconomic History   Marital status: Widowed    Spouse name: Not on file   Number of children: Not on file   Years of education: Not on file   Highest education level: Not on file  Occupational History   Not on file  Tobacco Use   Smoking status: Never   Smokeless tobacco: Never  Vaping Use   Vaping Use: Never used  Substance and Sexual Activity   Alcohol use: No   Drug use: No   Sexual activity: Not Currently    Comment: declined condoms  Other Topics Concern   Not on file  Social History Narrative   Not on file   Social Determinants of Health   Financial Resource Strain: Not on file  Food Insecurity: Not on file  Transportation Needs: Not on file  Physical Activity: Not on file  Stress: Not on file  Social Connections: Not on file    Allergies  Allergen Reactions   Sulfonamide Derivatives     REACTION: dangerous inflamation     Current Outpatient Medications:    amLODipine (NORVASC) 10 MG tablet, TAKE 1 TABLET(10 MG)  BY MOUTH DAILY, Disp: 30 tablet, Rfl: 2   citalopram (CELEXA) 20 MG tablet, TAKE 2 TABLETS(40 MG) BY MOUTH DAILY, Disp: 60 tablet, Rfl: 2   diclofenac sodium (VOLTAREN) 1 % GEL, Apply 2 g topically 4 (four) times daily., Disp: 100 g, Rfl: 2   elvitegravir-cobicistat-emtricitabine-tenofovir (GENVOYA) 150-150-200-10 MG TABS tablet, Take 1 tablet by mouth daily with breakfast., Disp: 30 tablet, Rfl: 11   hydrochlorothiazide (HYDRODIURIL) 25 MG tablet, TAKE 1 TABLET(25 MG) BY MOUTH DAILY, Disp: 30 tablet, Rfl: 2   loratadine (CLARITIN) 10 MG tablet, Take 10 mg by mouth daily.   (Patient not taking: Reported on 12/20/2021), Disp: , Rfl:    meloxicam (MOBIC) 15 MG tablet, Take 15 mg by mouth daily. (Patient not taking: Reported on 01/02/2022), Disp: , Rfl:    methocarbamol (ROBAXIN) 500 MG tablet, Take 1 tablet (500 mg total) by mouth every 8 (eight) hours as needed for muscle spasms., Disp: 20 tablet, Rfl: 0   MOUNJARO 2.5 MG/0.5ML Pen, SMARTSIG:2.5 Milligram(s) SUB-Q Once a Week (Patient not taking: Reported on 02/16/2022), Disp: , Rfl:    naproxen (NAPROSYN) 375 MG tablet, Take 1 tablet (375 mg total) by mouth  2 (two) times daily., Disp: 20 tablet, Rfl: 0   naproxen sodium (ANAPROX) 220 MG tablet, Take 220 mg by mouth 2 (two) times daily with a meal. (Patient not taking: Reported on 06/15/2022), Disp: , Rfl:    Sodium Sulfate-Mag Sulfate-KCl (SUTAB) [id number] MG TABS, Take 1 kit by mouth as directed. MANUFACTURER CODES!! BIN: [id number] PCN: CN GROUP: [id number] MEMBER ID: [id number];[employer] AS SECONDARY INSURANCE ;NO PRIOR AUTHORIZATION (Patient not taking: Reported on 11/22/2022), Disp: 24 tablet, Rfl: 0   valsartan (DIOVAN) 160 MG tablet, Take 160 mg by mouth daily., Disp: , Rfl:     Review of Systems  Constitutional:  Negative for activity change, appetite change, chills, diaphoresis, fatigue, fever and unexpected weight change.  HENT:  Negative for congestion, rhinorrhea, sinus pressure, sneezing, sore  throat and trouble swallowing.   Eyes:  Negative for photophobia and visual disturbance.  Respiratory:  Negative for cough, chest tightness, shortness of breath, wheezing and stridor.   Cardiovascular:  Negative for chest pain, palpitations and leg swelling.  Gastrointestinal:  Negative for abdominal distention, abdominal pain, anal bleeding, blood in stool, constipation, diarrhea, nausea and vomiting.  Genitourinary:  Negative for difficulty urinating, dysuria, flank pain and hematuria.  Musculoskeletal:  Negative for arthralgias, back pain, gait problem, joint swelling and myalgias.  Skin:  Negative for color change, pallor, rash and wound.  Neurological:  Negative for dizziness, tremors, weakness and light-headedness.  Hematological:  Negative for adenopathy. Does not bruise/bleed easily.  Psychiatric/Behavioral:  Negative for agitation, behavioral problems, confusion, decreased concentration, dysphoric mood and sleep disturbance.        Objective:   Physical Exam Constitutional:      General: She is not in acute distress.    Appearance: Normal appearance. She is well-developed. She is not ill-appearing or diaphoretic.  HENT:     Head: Normocephalic and atraumatic.     Right Ear: Hearing and external ear normal.     Left Ear: Hearing and external ear normal.     Nose: No nasal deformity or rhinorrhea.  Eyes:     General: No scleral icterus.    Conjunctiva/sclera: Conjunctivae normal.     Right eye: Right conjunctiva is not injected.     Left eye: Left conjunctiva is not injected.     Pupils: Pupils are equal, round, and reactive to light.  Neck:     Vascular: No JVD.  Cardiovascular:     Rate and Rhythm: Normal rate and regular rhythm.     Heart sounds: S1 normal and S2 normal.  Pulmonary:     Effort: Pulmonary effort is normal. No respiratory distress.     Breath sounds: No wheezing.  Abdominal:     Palpations: Abdomen is soft.  Musculoskeletal:        General: Normal  range of motion.     Right shoulder: Normal.     Left shoulder: Normal.     Cervical back: Normal range of motion and neck supple.     Right hip: Normal.     Left hip: Normal.     Right knee: Normal.     Left knee: Normal.  Lymphadenopathy:     Head:     Right side of head: No submandibular, preauricular or posterior auricular adenopathy.     Left side of head: No submandibular, preauricular or posterior auricular adenopathy.     Cervical: No cervical adenopathy.     Right cervical: No superficial or deep cervical adenopathy.    Left cervical: No superficial  or deep cervical adenopathy.  Skin:    General: Skin is warm and dry.     Coloration: Skin is not pale.     Findings: No abrasion, bruising, ecchymosis, erythema, lesion or rash.     Nails: There is no clubbing.  Neurological:     General: No focal deficit present.     Mental Status: She is alert and oriented to person, place, and time.     Sensory: No sensory deficit.     Coordination: Coordination normal.     Gait: Gait normal.  Psychiatric:        Attention and Perception: She is attentive.        Mood and Affect: Mood normal.        Speech: Speech normal.        Behavior: Behavior normal. Behavior is cooperative.        Thought Content: Thought content normal.        Judgment: Judgment normal.           Assessment & Plan:     HIV disease:  I have reviewed Lisseth Adeyemi's labs including viral load which was  Lab Results  Component Value Date   HIV1RNAQUANT <20 (H) 05/29/2023   and cd4 which was  Lab Results  Component Value Date   CD4TABS 2,135 (H) 11/08/2022     I am going to switch her to [facility] but she just filled her Genvoya so the switch will happen in 1 month's time we will recheck her labs in 3 months time  Hyperlipidemia I am going to increase her Crestor dose to 10 mg when she makes a switch to [facility]  Lipid Panel     Component Value Date/Time   CHOL 208 (H) 05/29/2023 0847   TRIG 221  (H) 05/29/2023 0847   HDL 47 (L) 05/29/2023 0847   CHOLHDL 4.4 05/29/2023 0847   VLDL 31 03/04/2015 0940   LDLCALC 125 (H) 05/29/2023 0847     Hypertension: She will continue on amlodipine carvedilol and hydrochlorothiazide  Obesity; on ozempic

## 2023-06-12 ENCOUNTER — Other Ambulatory Visit: Payer: Self-pay

## 2023-06-12 ENCOUNTER — Encounter: Payer: Self-pay | Admitting: Infectious Disease

## 2023-06-12 ENCOUNTER — Ambulatory Visit: Payer: Self-pay

## 2023-06-12 ENCOUNTER — Ambulatory Visit (INDEPENDENT_AMBULATORY_CARE_PROVIDER_SITE_OTHER): Payer: BC Managed Care – PPO | Admitting: Infectious Disease

## 2023-06-12 VITALS — BP 150/92 | HR 80 | Temp 97.6°F | Ht 68.0 in | Wt 256.0 lb

## 2023-06-12 DIAGNOSIS — I10 Essential (primary) hypertension: Secondary | ICD-10-CM

## 2023-06-12 DIAGNOSIS — E785 Hyperlipidemia, unspecified: Secondary | ICD-10-CM | POA: Diagnosis not present

## 2023-06-12 DIAGNOSIS — B20 Human immunodeficiency virus [HIV] disease: Secondary | ICD-10-CM

## 2023-06-12 MED ORDER — BIKTARVY 50-200-25 MG PO TABS
1.0000 | ORAL_TABLET | Freq: Every day | ORAL | 11 refills | Status: DC
Start: 2023-06-12 — End: 2023-10-08

## 2023-06-12 MED ORDER — ROSUVASTATIN CALCIUM 10 MG PO TABS: 10.0000 mg | ORAL_TABLET | Freq: Every day | ORAL | 11 refills | Status: DC

## 2023-08-02 IMAGING — DX DG KNEE COMPLETE 4+V*R*
4 series · 4 of 4 positions shown · non-contrast
Comparison: None.

CLINICAL DATA: Acute anterior/medial knee pain beginning 1 week ago
and worsening. Swelling.

EXAM:
RIGHT KNEE - COMPLETE 4+ VIEW

[knee ap]
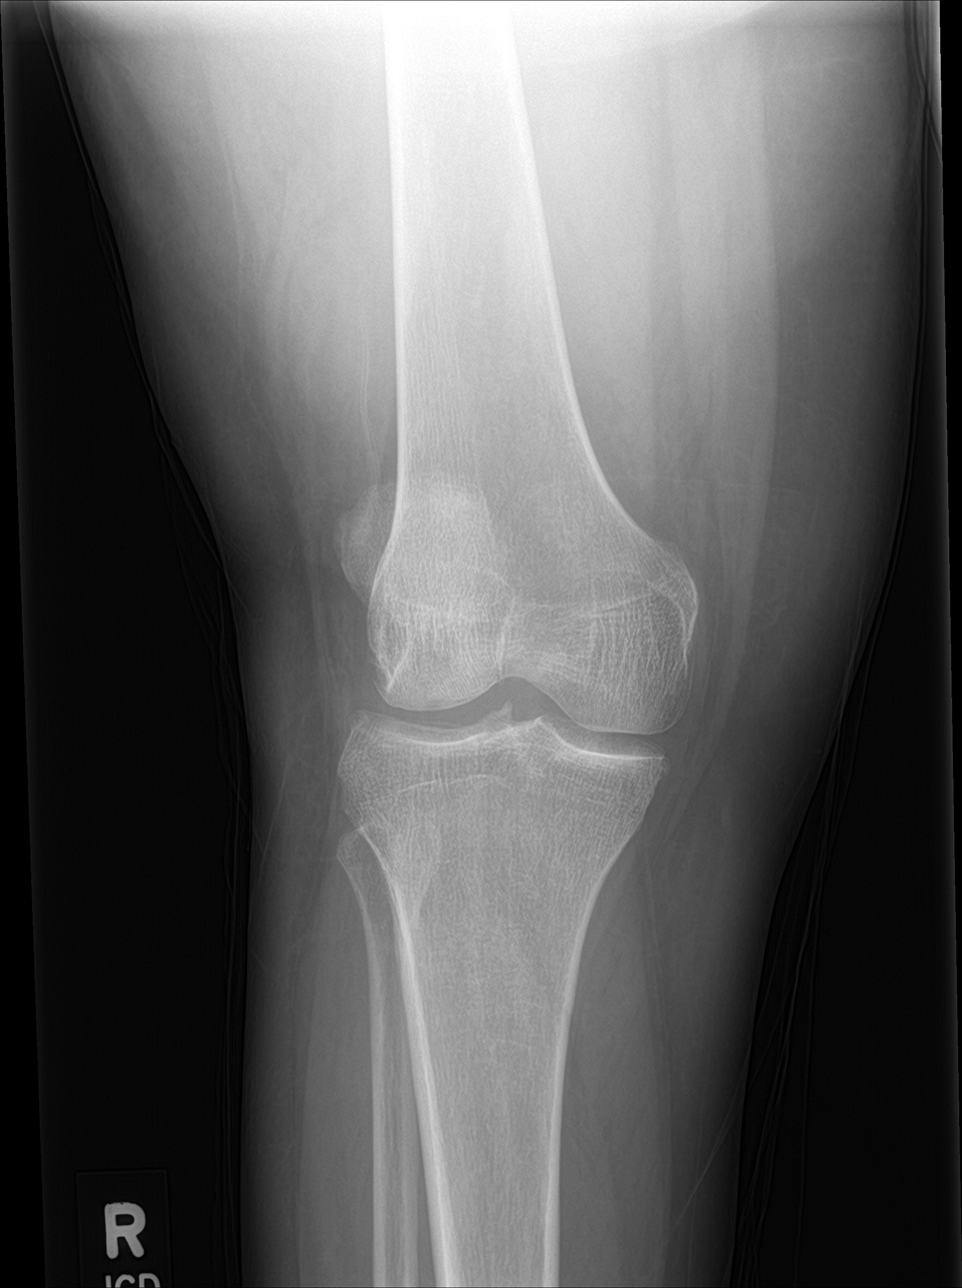

[knee obl (1 of 2)]
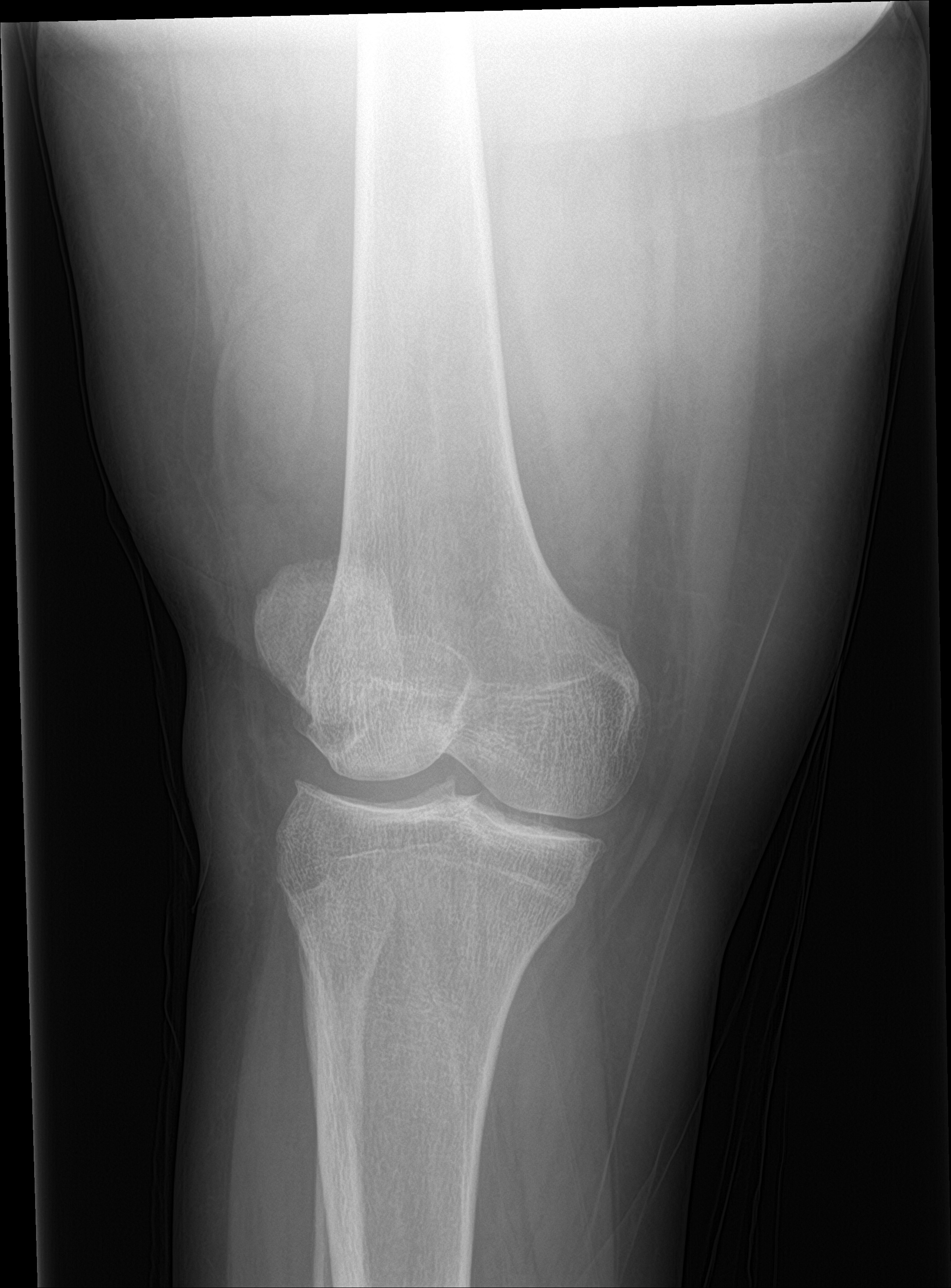

[knee obl (2 of 2)]
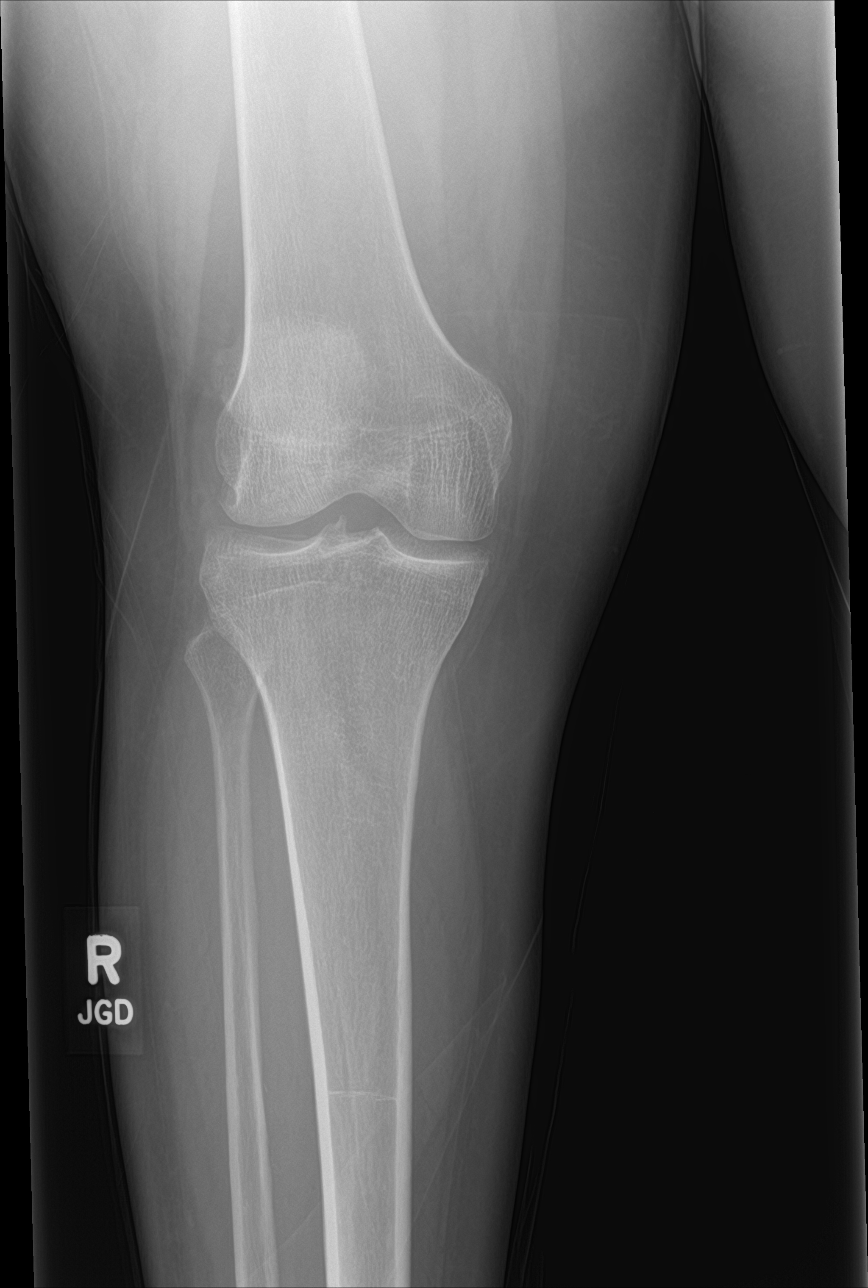

[knee lat]
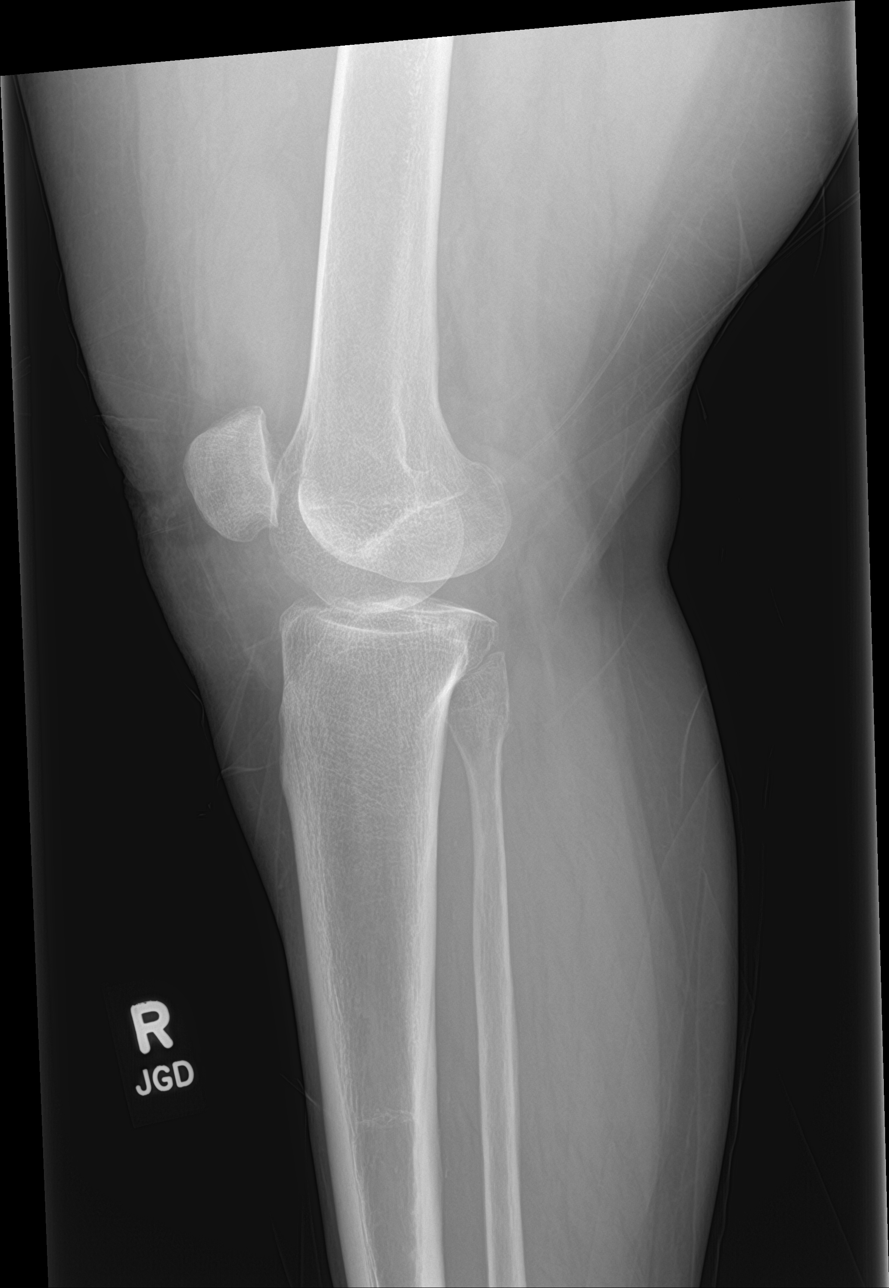

[4 of 4 positions shown; findings below may reference images not displayed]

FINDINGS: No acute fracture or dislocation is identified. There is a large
knee joint effusion. There may be mild lateral subluxation of the
patella although definitive assessment is limited in the absence of
a sunrise radiograph and this may instead be projectional.
Femorotibial joint space widths are preserved.
IMPRESSION: 1. Large knee joint effusion without acute osseous abnormality
identified.
2. Possible lateral subluxation of the patella.

## 2023-10-08 ENCOUNTER — Other Ambulatory Visit: Payer: Self-pay

## 2023-10-08 ENCOUNTER — Encounter: Payer: Self-pay | Admitting: Infectious Disease

## 2023-10-08 ENCOUNTER — Ambulatory Visit (INDEPENDENT_AMBULATORY_CARE_PROVIDER_SITE_OTHER): Payer: Medicaid Other | Admitting: Infectious Disease

## 2023-10-08 VITALS — BP 154/92 | HR 76 | Resp 16 | Ht 68.0 in | Wt 257.4 lb

## 2023-10-08 DIAGNOSIS — E785 Hyperlipidemia, unspecified: Secondary | ICD-10-CM

## 2023-10-08 DIAGNOSIS — Z23 Encounter for immunization: Secondary | ICD-10-CM | POA: Diagnosis not present

## 2023-10-08 DIAGNOSIS — B20 Human immunodeficiency virus [HIV] disease: Secondary | ICD-10-CM

## 2023-10-08 DIAGNOSIS — Z7185 Encounter for immunization safety counseling: Secondary | ICD-10-CM

## 2023-10-08 DIAGNOSIS — I10 Essential (primary) hypertension: Secondary | ICD-10-CM

## 2023-10-08 DIAGNOSIS — Z6834 Body mass index (BMI) 34.0-34.9, adult: Secondary | ICD-10-CM

## 2023-10-08 HISTORY — DX: Encounter for immunization safety counseling: Z71.85

## 2023-10-08 MED ORDER — ROSUVASTATIN CALCIUM 10 MG PO TABS: 10.0000 mg | ORAL_TABLET | Freq: Every day | ORAL | 11 refills | Status: DC

## 2023-10-08 MED ORDER — BIKTARVY 50-200-25 MG PO TABS: 1.0000 | ORAL_TABLET | Freq: Every day | ORAL | 11 refills | Status: DC

## 2023-10-08 NOTE — Addendum Note (Signed)
Addended by: Harley Alto on: 10/08/2023 09:25 AM   Modules accepted: Orders

## 2023-10-08 NOTE — Progress Notes (Signed)
Subjective:  Chief complaint: Follow-up for HIV disease on medications   Patient ID: Angela Crane, female    DOB: January 18, 1961, 62 y.o.   MRN: [id number]  HPI  Discussed the use of AI scribe software for clinical note transcription with the patient, who gave verbal consent to proceed.  History of Present Illness   The patient, with a history of HIV, was last seen in the summer when their antiretroviral therapy was switched from [place] to [place]. They report no side effects or problems with the new medication. They are also on a cholesterol medication, Crestor, and blood pressure medications including amlodipine, carvedilol, and hydrochlorothiazide. Additionally, they are on Ozempic for weight loss, prescribed by their primary care physician. The patient is planning to travel to [place] for Thanksgiving.        Past Medical History:  Diagnosis Date   Anxiety    Depression    HIV (human immunodeficiency virus infection) (HCC)    Hyperlipidemia 06/11/2023   Hypertension    Menopausal and perimenopausal disorder    Vaccine counseling 10/08/2023    Past Surgical History:  Procedure Laterality Date   NO PAST SURGERIES     12/20/21    Family History  Problem Relation Age of Onset   Diabetes Mother    HIV Daughter    Colon cancer Neg Hx    Colon polyps Neg Hx    Esophageal cancer Neg Hx    Rectal cancer Neg Hx    Stomach cancer Neg Hx       Social History   Socioeconomic History   Marital status: Widowed    Spouse name: Not on file   Number of children: Not on file   Years of education: Not on file   Highest education level: Not on file  Occupational History   Not on file  Tobacco Use   Smoking status: Never   Smokeless tobacco: Never  Vaping Use   Vaping status: Never Used  Substance and Sexual Activity   Alcohol use: No   Drug use: No   Sexual activity: Not Currently    Comment: declined condoms  Other Topics Concern   Not on file  Social History Narrative    Not on file   Social Determinants of Health   Financial Resource Strain: Not on file  Food Insecurity: Not on file  Transportation Needs: Not on file  Physical Activity: Not on file  Stress: Not on file  Social Connections: Unknown (08/17/2022)   Received from [facility]   Social Network    Social Network: Not on file    Allergies  Allergen Reactions   Sulfonamide Derivatives     REACTION: dangerous inflamation     Current Outpatient Medications:    amLODipine (NORVASC) 10 MG tablet, TAKE 1 TABLET(10 MG) BY MOUTH DAILY, Disp: 30 tablet, Rfl: 2   bictegravir-emtricitabine-tenofovir AF (BIKTARVY) 50-200-25 MG TABS tablet, Take 1 tablet by mouth daily., Disp: 30 tablet, Rfl: 11   carvedilol (COREG) 3.125 MG tablet, Take 6.25 mg by mouth 2 (two) times daily., Disp: , Rfl:    citalopram (CELEXA) 20 MG tablet, TAKE 2 TABLETS(40 MG) BY MOUTH DAILY, Disp: 60 tablet, Rfl: 2   diclofenac sodium (VOLTAREN) 1 % GEL, Apply 2 g topically 4 (four) times daily., Disp: 100 g, Rfl: 2   hydrochlorothiazide (HYDRODIURIL) 25 MG tablet, TAKE 1 TABLET(25 MG) BY MOUTH DAILY, Disp: 30 tablet, Rfl: 2   loratadine (CLARITIN) 10 MG tablet, Take 10 mg  by mouth daily.   (Patient not taking: Reported on 12/20/2021), Disp: , Rfl:    meloxicam (MOBIC) 15 MG tablet, Take 15 mg by mouth daily., Disp: , Rfl:    methocarbamol (ROBAXIN) 500 MG tablet, Take 1 tablet (500 mg total) by mouth every 8 (eight) hours as needed for muscle spasms., Disp: 20 tablet, Rfl: 0   MOUNJARO 2.5 MG/0.5ML Pen, SMARTSIG:2.5 Milligram(s) SUB-Q Once a Week (Patient not taking: Reported on 02/16/2022), Disp: , Rfl:    naproxen (NAPROSYN) 375 MG tablet, Take 1 tablet (375 mg total) by mouth 2 (two) times daily., Disp: 20 tablet, Rfl: 0   naproxen sodium (ANAPROX) 220 MG tablet, Take 220 mg by mouth 2 (two) times daily with a meal. (Patient not taking: Reported on 06/15/2022), Disp: , Rfl:    OZEMPIC, 0.25 OR 0.5 MG/DOSE, 2  MG/3ML SOPN, SMARTSIG:0.25 Milligram(s) SUB-Q Once a Week, Disp: , Rfl:    rosuvastatin (CRESTOR) 10 MG tablet, Take 1 tablet (10 mg total) by mouth daily. To start when you switch from [place] to [place], Disp: 30 tablet, Rfl: 11   valsartan (DIOVAN) 160 MG tablet, Take 160 mg by mouth daily. (Patient not taking: Reported on 06/12/2023), Disp: , Rfl:    valsartan-hydrochlorothiazide (DIOVAN-HCT) 320-25 MG tablet, Take 1 tablet by mouth daily., Disp: , Rfl:     Review of Systems  Constitutional:  Negative for activity change, appetite change, chills, diaphoresis, fatigue, fever and unexpected weight change.  HENT:  Negative for congestion, rhinorrhea, sinus pressure, sneezing, sore throat and trouble swallowing.   Eyes:  Negative for photophobia and visual disturbance.  Respiratory:  Negative for cough, chest tightness, shortness of breath, wheezing and stridor.   Cardiovascular:  Negative for chest pain, palpitations and leg swelling.  Gastrointestinal:  Negative for abdominal distention, abdominal pain, anal bleeding, blood in stool, constipation, diarrhea, nausea and vomiting.  Genitourinary:  Negative for difficulty urinating, dysuria, flank pain and hematuria.  Musculoskeletal:  Negative for arthralgias, back pain, gait problem, joint swelling and myalgias.  Skin:  Negative for color change, pallor, rash and wound.  Neurological:  Negative for dizziness, tremors, weakness and light-headedness.  Hematological:  Negative for adenopathy. Does not bruise/bleed easily.  Psychiatric/Behavioral:  Negative for agitation, behavioral problems, confusion, decreased concentration, dysphoric mood and sleep disturbance.        Objective:   Physical Exam Constitutional:      General: She is not in acute distress.    Appearance: Normal appearance. She is well-developed. She is not ill-appearing or diaphoretic.  HENT:     Head: Normocephalic and atraumatic.     Right Ear: Hearing and external ear  normal.     Left Ear: Hearing and external ear normal.     Nose: No nasal deformity or rhinorrhea.  Eyes:     General: No scleral icterus.    Conjunctiva/sclera: Conjunctivae normal.     Right eye: Right conjunctiva is not injected.     Left eye: Left conjunctiva is not injected.     Pupils: Pupils are equal, round, and reactive to light.  Neck:     Vascular: No JVD.  Cardiovascular:     Rate and Rhythm: Normal rate and regular rhythm.     Heart sounds: S1 normal and S2 normal.  Pulmonary:     Effort: Pulmonary effort is normal. No respiratory distress.     Breath sounds: No wheezing.  Abdominal:     General: Bowel sounds are normal. There is no distension.  Palpations: Abdomen is soft.     Tenderness: There is no abdominal tenderness.  Musculoskeletal:        General: Normal range of motion.     Right shoulder: Normal.     Left shoulder: Normal.     Cervical back: Normal range of motion and neck supple.     Right hip: Normal.     Left hip: Normal.     Right knee: Normal.     Left knee: Normal.  Lymphadenopathy:     Head:     Right side of head: No submandibular, preauricular or posterior auricular adenopathy.     Left side of head: No submandibular, preauricular or posterior auricular adenopathy.     Cervical: No cervical adenopathy.     Right cervical: No superficial or deep cervical adenopathy.    Left cervical: No superficial or deep cervical adenopathy.  Skin:    General: Skin is warm and dry.     Coloration: Skin is not pale.     Findings: No abrasion, bruising, ecchymosis, erythema, lesion or rash.     Nails: There is no clubbing.  Neurological:     Mental Status: She is alert and oriented to person, place, and time.     Sensory: No sensory deficit.     Coordination: Coordination normal.     Gait: Gait normal.  Psychiatric:        Attention and Perception: She is attentive.        Mood and Affect: Mood normal.        Speech: Speech normal.        Behavior:  Behavior normal. Behavior is cooperative.        Thought Content: Thought content normal.        Judgment: Judgment normal.           Assessment & Plan:   Assessment and Plan    HIV Stable on Biktarvy without side effects. Discussed the advantages of Biktarvy over Genvoya, including lack of booster and better compatibility with other medications. -Continue Biktarvy. -Check labs today to monitor response to medication change. --HIV quant RNA, CD4 CMP CBC w diff  Hyperlipidemia On rosuvastatin (Crestor). Discussed potential interaction with previous HIV medication (Genvoya). -Continue rosuvastatin, though she may need increase dose down the road since she will not be on booster  Hypertension On amlodipine, carvedilol, and hydrochlorothiazide. -Continue current regimen.  Weight Management On Ozempic 0.25 for weight loss, prescribed by primary care physician. -Continue Ozempic 0.25 for weight loss.  Vaccinations Due for influenza and COVID-19 vaccines. -Administer influenza and COVID-19 vaccines today.  Follow-up Discussed frequency of visits and preference for labs prior to visit. -Schedule follow-up visit in 9-10 months with labs prior to visit.

## 2023-10-09 ENCOUNTER — Other Ambulatory Visit (HOSPITAL_COMMUNITY)
Admission: RE | Admit: 2023-10-09 | Discharge: 2023-10-09 | Disposition: A | Discharge status: Home / Self Care | Payer: Medicaid Other | Source: Ambulatory Visit | Attending: Infectious Disease | Admitting: Infectious Disease

## 2023-10-09 ENCOUNTER — Other Ambulatory Visit: Payer: Self-pay

## 2023-10-09 ENCOUNTER — Other Ambulatory Visit: Payer: Medicaid Other

## 2023-10-09 DIAGNOSIS — B20 Human immunodeficiency virus [HIV] disease: Secondary | ICD-10-CM | POA: Insufficient documentation

## 2023-10-09 LAB — T-HELPER CELLS (CD4) COUNT (NOT AT ARMC)
CD4 % Helper T Cell: 51 % (ref 33–65)
CD4 T Cell Abs: 1718 /uL (ref 400–1790)

## 2023-10-10 LAB — URINE CYTOLOGY ANCILLARY ONLY
Chlamydia: NEGATIVE
Comment: NEGATIVE
Comment: NORMAL
Neisseria Gonorrhea: NEGATIVE

## 2023-10-11 LAB — COMPLETE METABOLIC PANEL WITH GFR
AG Ratio: 1.2 (calc) (ref 1.0–2.5)
ALT: 14 U/L (ref 6–29)
AST: 22 U/L (ref 10–35)
Albumin: 4.1 g/dL (ref 3.6–5.1)
Alkaline phosphatase (APISO): 88 U/L (ref 37–153)
BUN: 7 mg/dL (ref 7–25)
CO2: 25 mmol/L (ref 20–32)
Calcium: 9.3 mg/dL (ref 8.6–10.4)
Chloride: 102 mmol/L (ref 98–110)
Creat: 0.84 mg/dL (ref 0.50–1.05)
Globulin: 3.3 g/dL (ref 1.9–3.7)
Glucose, Bld: 108 mg/dL — ABNORMAL HIGH (ref 65–99)
Potassium: 4.3 mmol/L (ref 3.5–5.3)
Sodium: 140 mmol/L (ref 135–146)
Total Bilirubin: 0.3 mg/dL (ref 0.2–1.2)
Total Protein: 7.4 g/dL (ref 6.1–8.1)
eGFR: 79 mL/min/{1.73_m2} (ref >=60)

## 2023-10-11 LAB — CBC WITH DIFFERENTIAL/PLATELET
Absolute Lymphocytes: 4068 {cells}/uL — ABNORMAL HIGH (ref 850–3900)
Absolute Monocytes: 419 {cells}/uL (ref 200–950)
Basophils Absolute: 50 {cells}/uL (ref 0–200)
Basophils Relative: 0.7 %
Eosinophils Absolute: 71 {cells}/uL (ref 15–500)
Eosinophils Relative: 1 %
HCT: 44.8 % (ref 35.0–45.0)
Hemoglobin: 15.1 g/dL (ref 11.7–15.5)
MCH: 29 pg (ref 27.0–33.0)
MCHC: 33.7 g/dL (ref 32.0–36.0)
MCV: 86.2 fL (ref 80.0–100.0)
MPV: 11.6 fL (ref 7.5–12.5)
Monocytes Relative: 5.9 %
Neutro Abs: 2492 {cells}/uL (ref 1500–7800)
Neutrophils Relative %: 35.1 %
Platelets: 148 10*3/uL (ref 140–400)
RBC: 5.2 10*6/uL — ABNORMAL HIGH (ref 3.80–5.10)
RDW: 14.2 % (ref 11.0–15.0)
Total Lymphocyte: 57.3 %
WBC: 7.1 10*3/uL (ref 3.8–10.8)

## 2023-10-11 LAB — RPR: RPR Ser Ql: NONREACTIVE

## 2023-10-11 LAB — LIPID PANEL
Cholesterol: 191 mg/dL (ref <200)
HDL: 50 mg/dL (ref >=50)
LDL Cholesterol (Calc): 102 mg/dL — ABNORMAL HIGH
Non-HDL Cholesterol (Calc): 141 mg/dL — ABNORMAL HIGH (ref <130)
Total CHOL/HDL Ratio: 3.8 (calc) (ref <5.0)
Triglycerides: 270 mg/dL — ABNORMAL HIGH (ref <150)

## 2023-10-11 LAB — HIV-1 RNA QUANT-NO REFLEX-BLD
HIV 1 RNA Quant: 22 {copies}/mL — ABNORMAL HIGH
HIV-1 RNA Quant, Log: 1.34 {Log_copies}/mL — ABNORMAL HIGH

## 2024-04-24 NOTE — Progress Notes (Signed)
 The 10-year ASCVD risk score (Arnett DK, et al., 2019) is: 21.9%   Values used to calculate the score:     Age: 63 years     Sex: Female     Is Non-Hispanic African American: Yes     Diabetic: Yes     Tobacco smoker: No     Systolic Blood Pressure: 138 mmHg     Is BP treated: Yes     HDL Cholesterol: 50 mg/dL     Total Cholesterol: 191 mg/dL  Currently prescribed rosuvastatin  10 mg.   Angela Crane, BSN, RN

## 2024-07-22 ENCOUNTER — Other Ambulatory Visit: Payer: Self-pay | Admitting: Infectious Disease

## 2024-08-03 NOTE — Progress Notes (Unsigned)
 Subjective:  Chief complaint: follow-up for HIV disease on medications   Patient ID: Angela Crane, female    DOB: July 21, 1961, 63 y.o.   MRN: [id number]  HPI  Past Medical History:  Diagnosis Date   Anxiety    Depression    HIV (human immunodeficiency virus infection) (HCC)    Hyperlipidemia 06/11/2023   Hypertension    Menopausal and perimenopausal disorder    Vaccine counseling 10/08/2023    Past Surgical History:  Procedure Laterality Date   NO PAST SURGERIES     12/20/21    Family History  Problem Relation Age of Onset   Diabetes Mother    HIV Daughter    Colon cancer Neg Hx    Colon polyps Neg Hx    Esophageal cancer Neg Hx    Rectal cancer Neg Hx    Stomach cancer Neg Hx       Social History   Socioeconomic History   Marital status: Widowed    Spouse name: Not on file   Number of children: Not on file   Years of education: Not on file   Highest education level: Not on file  Occupational History   Not on file  Tobacco Use   Smoking status: Never   Smokeless tobacco: Never  Vaping Use   Vaping status: Never Used  Substance and Sexual Activity   Alcohol use: No   Drug use: No   Sexual activity: Not Currently    Comment: declined condoms  Other Topics Concern   Not on file  Social History Narrative   Not on file   Social Drivers of Health   Financial Resource Strain: Not on file  Food Insecurity: Not on file  Transportation Needs: Not on file  Physical Activity: Not on file  Stress: Not on file  Social Connections: Unknown (08/17/2022)   Received from [facility]   Social Network    Social Network: Not on file    Allergies  Allergen Reactions   Sulfonamide Derivatives     REACTION: dangerous inflamation     Current Outpatient Medications:    amLODipine  (NORVASC ) 10 MG tablet, TAKE 1 TABLET(10 MG) BY MOUTH DAILY, Disp: 30 tablet, Rfl: 2   bictegravir-emtricitabine -tenofovir  AF (BIKTARVY ) 50-200-25 MG TABS tablet, Take 1 tablet by  mouth daily., Disp: 30 tablet, Rfl: 11   carvedilol (COREG) 3.125 MG tablet, Take 6.25 mg by mouth 2 (two) times daily., Disp: , Rfl:    citalopram  (CELEXA ) 20 MG tablet, TAKE 2 TABLETS(40 MG) BY MOUTH DAILY, Disp: 60 tablet, Rfl: 2   diclofenac  sodium (VOLTAREN ) 1 % GEL, Apply 2 g topically 4 (four) times daily., Disp: 100 g, Rfl: 2   hydrochlorothiazide  (HYDRODIURIL ) 25 MG tablet, TAKE 1 TABLET(25 MG) BY MOUTH DAILY, Disp: 30 tablet, Rfl: 2   loratadine (CLARITIN) 10 MG tablet, Take 10 mg by mouth daily.   (Patient not taking: Reported on 12/20/2021), Disp: , Rfl:    meloxicam (MOBIC) 15 MG tablet, Take 15 mg by mouth daily., Disp: , Rfl:    methocarbamol  (ROBAXIN ) 500 MG tablet, Take 1 tablet (500 mg total) by mouth every 8 (eight) hours as needed for muscle spasms., Disp: 20 tablet, Rfl: 0   MOUNJARO 2.5 MG/0.5ML Pen, SMARTSIG:2.5 Milligram(s) SUB-Q Once a Week (Patient not taking: Reported on 02/16/2022), Disp: , Rfl:    OZEMPIC, 0.25 OR 0.5 MG/DOSE, 2 MG/3ML SOPN, SMARTSIG:0.25 Milligram(s) SUB-Q Once a Week, Disp: , Rfl:    rosuvastatin  (CRESTOR ) 10 MG tablet, TAKE  1 TABLET(10 MG) BY MOUTH DAILY. START WHEN YOU SWITCH FROM GENVOYA  TO BIKTARVY , Disp: 30 tablet, Rfl: 0   valsartan (DIOVAN) 160 MG tablet, Take 160 mg by mouth daily. (Patient not taking: Reported on 06/12/2023), Disp: , Rfl:    valsartan-hydrochlorothiazide  (DIOVAN-HCT) 320-25 MG tablet, Take 1 tablet by mouth daily., Disp: , Rfl:    Review of Systems     Objective:   Physical Exam        Assessment & Plan:

## 2024-08-04 ENCOUNTER — Ambulatory Visit (INDEPENDENT_AMBULATORY_CARE_PROVIDER_SITE_OTHER): Payer: Medicaid Other | Admitting: Infectious Disease

## 2024-08-04 ENCOUNTER — Other Ambulatory Visit (HOSPITAL_COMMUNITY)
Admission: RE | Admit: 2024-08-04 | Discharge: 2024-08-04 | Disposition: A | Discharge status: Home / Self Care | Source: Ambulatory Visit | Attending: Infectious Disease | Admitting: Infectious Disease

## 2024-08-04 ENCOUNTER — Other Ambulatory Visit: Payer: Self-pay

## 2024-08-04 ENCOUNTER — Ambulatory Visit: Payer: Self-pay

## 2024-08-04 VITALS — BP 155/99 | HR 78 | Temp 98.6°F | Wt 248.0 lb

## 2024-08-04 DIAGNOSIS — Z7185 Encounter for immunization safety counseling: Secondary | ICD-10-CM

## 2024-08-04 DIAGNOSIS — E785 Hyperlipidemia, unspecified: Secondary | ICD-10-CM

## 2024-08-04 DIAGNOSIS — I10 Essential (primary) hypertension: Secondary | ICD-10-CM

## 2024-08-04 DIAGNOSIS — M25571 Pain in right ankle and joints of right foot: Secondary | ICD-10-CM | POA: Diagnosis not present

## 2024-08-04 DIAGNOSIS — B20 Human immunodeficiency virus [HIV] disease: Secondary | ICD-10-CM

## 2024-08-04 MED ORDER — BIKTARVY 50-200-25 MG PO TABS: 1.0000 | ORAL_TABLET | Freq: Every day | ORAL | 11 refills | Status: DC

## 2024-08-04 MED ORDER — BIKTARVY 50-200-25 MG PO TABS: 1.0000 | ORAL_TABLET | Freq: Every day | ORAL | 11 refills | Status: AC

## 2024-08-05 LAB — URINE CYTOLOGY ANCILLARY ONLY
Chlamydia: NEGATIVE
Comment: NEGATIVE
Comment: NORMAL
Neisseria Gonorrhea: NEGATIVE

## 2024-08-06 LAB — COMPLETE METABOLIC PANEL WITHOUT GFR
AG Ratio: 1.4 (calc) (ref 1.0–2.5)
ALT: 12 U/L (ref 6–29)
AST: 22 U/L (ref 10–35)
Albumin: 4.2 g/dL (ref 3.6–5.1)
Alkaline phosphatase (APISO): 83 U/L (ref 37–153)
BUN: 13 mg/dL (ref 7–25)
CO2: 25 mmol/L (ref 20–32)
Calcium: 9 mg/dL (ref 8.6–10.4)
Chloride: 104 mmol/L (ref 98–110)
Creat: 0.8 mg/dL (ref 0.50–1.05)
Globulin: 3.1 g/dL (ref 1.9–3.7)
Glucose, Bld: 100 mg/dL — ABNORMAL HIGH (ref 65–99)
Potassium: 3.9 mmol/L (ref 3.5–5.3)
Sodium: 139 mmol/L (ref 135–146)
Total Bilirubin: 0.4 mg/dL (ref 0.2–1.2)
Total Protein: 7.3 g/dL (ref 6.1–8.1)

## 2024-08-06 LAB — CBC WITH DIFFERENTIAL/PLATELET
Absolute Lymphocytes: 2706 {cells}/uL (ref 850–3900)
Absolute Monocytes: 360 {cells}/uL (ref 200–950)
Basophils Absolute: 30 {cells}/uL (ref 0–200)
Basophils Relative: 0.5 %
Eosinophils Absolute: 138 {cells}/uL (ref 15–500)
Eosinophils Relative: 2.3 %
HCT: 43.8 % (ref 35.0–45.0)
Hemoglobin: 14.7 g/dL (ref 11.7–15.5)
MCH: 29.9 pg (ref 27.0–33.0)
MCHC: 33.6 g/dL (ref 32.0–36.0)
MCV: 89 fL (ref 80.0–100.0)
MPV: 10.5 fL (ref 7.5–12.5)
Monocytes Relative: 6 %
Neutro Abs: 2766 {cells}/uL (ref 1500–7800)
Neutrophils Relative %: 46.1 %
Platelets: 272 Thousand/uL (ref 140–400)
RBC: 4.92 Million/uL (ref 3.80–5.10)
RDW: 14.5 % (ref 11.0–15.0)
Total Lymphocyte: 45.1 %
WBC: 6 Thousand/uL (ref 3.8–10.8)

## 2024-08-06 LAB — HIV-1 RNA QUANT-NO REFLEX-BLD
HIV 1 RNA Quant: NOT DETECTED {copies}/mL
HIV-1 RNA Quant, Log: NOT DETECTED {Log_copies}/mL

## 2024-08-06 LAB — LIPID PANEL
Cholesterol: 205 mg/dL — ABNORMAL HIGH (ref <200)
HDL: 48 mg/dL — ABNORMAL LOW (ref >=50)
LDL Cholesterol (Calc): 115 mg/dL — ABNORMAL HIGH
Non-HDL Cholesterol (Calc): 157 mg/dL — ABNORMAL HIGH (ref <130)
Total CHOL/HDL Ratio: 4.3 (calc) (ref <5.0)
Triglycerides: 292 mg/dL — ABNORMAL HIGH (ref <150)

## 2024-08-06 LAB — RPR: RPR Ser Ql: NONREACTIVE

## 2024-08-06 LAB — T-HELPER CELLS (CD4) COUNT (NOT AT ARMC)
CD4 % Helper T Cell: 55 % (ref 33–65)
CD4 T Cell Abs: 1295 /uL (ref 400–1790)

## 2024-08-15 NOTE — Progress Notes (Signed)
   I, Angela Crane, CMA acting as a Neurosurgeon for Artist Lloyd, MD.  Angela Crane is a 63 y.o. female who presents to [facility] today for R ankle pain x 2 months, since starting new job, required standing in place for 3 hours at a time. Pt initial injured her ankle in a fall, but pain resolved and now is flared up again, after prolonged standing. Pt locates pain to lateral aspect. Improves with movement, worsens with prolonged time standing. Sharp pain deep in the joint. Causes a limp at times.  R ankle swelling: denies Aggravates: prolonged standing Treatments tried: movement, Tylenol , IBU, Methocarbamol -IBU (from [place])  Pertinent review of systems: No fevers or chills  Relevant historical information: Hypertension   Exam:  BP 138/88   Pulse 74   Ht 5' 8 (1.727 m)   Wt 250 lb (113.4 kg)   LMP 08/20/2013   SpO2 96%   BMI 38.01 kg/m  General: Well Developed, well nourished, and in no acute distress.   MSK: Right ankle pronation is present.  Tender palpation at lateral ankle. Normal ankle motion. Intact strength.    Lab and Radiology Results  Diagnostic Limited MSK Ultrasound of: Right lateral ankle Peroneal tendons are intact but there is hypoechoic fluid around the tendon sheath consistent with tendinitis. Impression: Peroneal tenosynovitis     Assessment and Plan: 63 y.o. female with right lateral ankle pain due to peroneal tenosynovitis.  Plan for home exercise program and topical diclofenac  gel.  Okay to use methocarbamol  at bedtime and use ibuprofen intermittently.  Recommend compression sleeve which he already has. Recheck in 6 weeks.  If not improving consider physical therapy.   PDMP not reviewed this encounter. Orders Placed This Encounter  Procedures   US  LIMITED JOINT SPACE STRUCTURES LOW RIGHT(NO LINKED CHARGES)    Reason for Exam (SYMPTOM  OR DIAGNOSIS REQUIRED):   right ankle pain    Preferred imaging location?:    [facility] ordered this encounter  Medications   methocarbamol  (ROBAXIN ) 500 MG tablet    Sig: Take 1 tablet (500 mg total) by mouth at bedtime as needed for muscle spasms.    Dispense:  30 tablet    Refill:  1     Discussed warning signs or symptoms. Please see discharge instructions. Patient expresses understanding.   The above documentation has been reviewed and is accurate and complete Artist Lloyd, M.D.

## 2024-08-18 ENCOUNTER — Other Ambulatory Visit: Payer: Self-pay

## 2024-08-18 ENCOUNTER — Encounter: Payer: Self-pay | Admitting: Family Medicine

## 2024-08-18 ENCOUNTER — Ambulatory Visit (INDEPENDENT_AMBULATORY_CARE_PROVIDER_SITE_OTHER): Admitting: Family Medicine

## 2024-08-18 VITALS — BP 138/88 | HR 74 | Ht 68.0 in | Wt 250.0 lb

## 2024-08-18 DIAGNOSIS — G8929 Other chronic pain: Secondary | ICD-10-CM | POA: Diagnosis not present

## 2024-08-18 DIAGNOSIS — M25571 Pain in right ankle and joints of right foot: Secondary | ICD-10-CM | POA: Diagnosis not present

## 2024-08-18 MED ORDER — METHOCARBAMOL 500 MG PO TABS: 500.0000 mg | ORAL_TABLET | Freq: Every evening | ORAL | 1 refills | Status: AC | PRN

## 2024-08-18 NOTE — Patient Instructions (Addendum)
 Thank you for coming in today.   Please work on the home exercises the athletic trainer went over with you:  View at [email or web address] code [id number]  Please use Voltaren  gel (Generic Diclofenac  Gel) up to 4x daily for pain as needed.  This is available over-the-counter as both the name brand Voltaren  gel and the generic diclofenac  gel.   Prescribed Methocarbamol   Consider Physical Therapy if no improvement.   See you back in 6 weeks

## 2024-10-09 ENCOUNTER — Other Ambulatory Visit: Payer: Self-pay

## 2024-10-09 MED ORDER — FLUZONE 0.5 ML IM SUSY
0.5000 mL | PREFILLED_SYRINGE | Freq: Once | INTRAMUSCULAR | 0 refills | Status: AC
  Filled 2024-10-09: qty 0.5, 1d supply, fill #0

## 2024-10-09 MED ORDER — COVID-19 MRNA VAC-TRIS(PFIZER) 30 MCG/0.3ML IM SUSY
0.3000 mL | PREFILLED_SYRINGE | Freq: Once | INTRAMUSCULAR | 0 refills | Status: AC
  Filled 2024-10-09: qty 0.3, 1d supply, fill #0

## 2025-01-20 ENCOUNTER — Ambulatory Visit: Admitting: Infectious Disease

## 2025-01-27 ENCOUNTER — Ambulatory Visit: Admitting: Infectious Disease
